# Patient Record
Sex: Male | Born: 1970 | Race: White | Hispanic: No | Marital: Married | Smoking: Never smoker
Health system: Southern US, Community
[De-identification: ages and names within clinical notes are randomized; demographics above are authoritative.]

## PROBLEM LIST (undated history)

## (undated) DIAGNOSIS — E785 Hyperlipidemia, unspecified: Secondary | ICD-10-CM

## (undated) DIAGNOSIS — Z8249 Family history of ischemic heart disease and other diseases of the circulatory system: Secondary | ICD-10-CM

## (undated) DIAGNOSIS — U071 COVID-19: Secondary | ICD-10-CM

## (undated) DIAGNOSIS — D239 Other benign neoplasm of skin, unspecified: Secondary | ICD-10-CM

## (undated) DIAGNOSIS — G2581 Restless legs syndrome: Secondary | ICD-10-CM

## (undated) DIAGNOSIS — T7840XA Allergy, unspecified, initial encounter: Secondary | ICD-10-CM

## (undated) DIAGNOSIS — R002 Palpitations: Secondary | ICD-10-CM

## (undated) DIAGNOSIS — K649 Unspecified hemorrhoids: Secondary | ICD-10-CM

## (undated) DIAGNOSIS — E559 Vitamin D deficiency, unspecified: Secondary | ICD-10-CM

## (undated) HISTORY — DX: COVID-19: U07.1

## (undated) HISTORY — DX: Palpitations: R00.2

## (undated) HISTORY — DX: Other benign neoplasm of skin, unspecified: D23.9

## (undated) HISTORY — DX: Hyperlipidemia, unspecified: E78.5

## (undated) HISTORY — DX: Vitamin D deficiency, unspecified: E55.9

## (undated) HISTORY — DX: Unspecified hemorrhoids: K64.9

## (undated) HISTORY — PX: THROAT SURGERY: SHX803

## (undated) HISTORY — PX: SHOULDER SURGERY: SHX246

## (undated) HISTORY — DX: Family history of ischemic heart disease and other diseases of the circulatory system: Z82.49

## (undated) HISTORY — DX: Allergy, unspecified, initial encounter: T78.40XA

## (undated) HISTORY — DX: Restless legs syndrome: G25.81

---

## 2001-05-10 ENCOUNTER — Ambulatory Visit (HOSPITAL_COMMUNITY): Admission: RE | Admit: 2001-05-10 | Discharge: 2001-05-10 | Payer: Self-pay | Admitting: Family Medicine

## 2004-01-27 ENCOUNTER — Emergency Department (HOSPITAL_COMMUNITY): Admission: EM | Admit: 2004-01-27 | Discharge: 2004-01-27 | Payer: Self-pay | Admitting: Emergency Medicine

## 2008-12-24 ENCOUNTER — Encounter: Admission: RE | Admit: 2008-12-24 | Discharge: 2008-12-24 | Payer: Self-pay | Admitting: Internal Medicine

## 2012-12-07 HISTORY — PX: SKIN CANCER EXCISION: SHX779

## 2013-03-11 ENCOUNTER — Other Ambulatory Visit: Payer: Self-pay | Admitting: Internal Medicine

## 2013-03-11 DIAGNOSIS — R7401 Elevation of levels of liver transaminase levels: Secondary | ICD-10-CM

## 2013-03-14 ENCOUNTER — Ambulatory Visit
Admission: RE | Admit: 2013-03-14 | Discharge: 2013-03-14 | Disposition: A | Discharge status: Home / Self Care | Payer: BC Managed Care – PPO | Source: Ambulatory Visit | Attending: Internal Medicine | Admitting: Internal Medicine

## 2013-03-14 DIAGNOSIS — R7401 Elevation of levels of liver transaminase levels: Secondary | ICD-10-CM

## 2013-03-18 ENCOUNTER — Other Ambulatory Visit: Payer: Self-pay | Admitting: Emergency Medicine

## 2013-03-18 ENCOUNTER — Telehealth: Payer: Self-pay

## 2013-03-18 DIAGNOSIS — B351 Tinea unguium: Secondary | ICD-10-CM

## 2013-03-18 MED ORDER — TERBINAFINE HCL 250 MG PO TABS: 250.0000 mg | ORAL_TABLET | Freq: Every day | ORAL | Status: DC

## 2013-03-18 NOTE — Telephone Encounter (Signed)
Returned call

## 2013-03-28 ENCOUNTER — Encounter: Payer: Self-pay | Admitting: Physician Assistant

## 2013-03-28 DIAGNOSIS — T7840XA Allergy, unspecified, initial encounter: Secondary | ICD-10-CM | POA: Insufficient documentation

## 2013-03-28 DIAGNOSIS — E785 Hyperlipidemia, unspecified: Secondary | ICD-10-CM

## 2013-03-28 DIAGNOSIS — T7840XD Allergy, unspecified, subsequent encounter: Secondary | ICD-10-CM

## 2013-03-28 DIAGNOSIS — E559 Vitamin D deficiency, unspecified: Secondary | ICD-10-CM | POA: Insufficient documentation

## 2013-03-29 ENCOUNTER — Encounter: Payer: Self-pay | Admitting: Physician Assistant

## 2013-03-29 ENCOUNTER — Ambulatory Visit: Payer: BC Managed Care – PPO | Admitting: Physician Assistant

## 2013-03-29 VITALS — BP 102/68 | HR 64 | Temp 98.8°F | Resp 16 | Ht 72.5 in | Wt 227.0 lb

## 2013-03-29 DIAGNOSIS — J209 Acute bronchitis, unspecified: Secondary | ICD-10-CM

## 2013-03-29 MED ORDER — PREDNISONE 20 MG PO TABS: ORAL_TABLET | ORAL | Status: DC

## 2013-03-29 MED ORDER — AZITHROMYCIN 250 MG PO TABS: ORAL_TABLET | ORAL | Status: DC

## 2013-03-29 NOTE — Progress Notes (Signed)
  Subjective:    Patient ID: Kristopher Mendez, male    DOB: 04/02/1971, 42 y.o.   MRN: [id number]  URI  This is a new problem. The current episode started in the past 7 days. The problem has been gradually worsening. There has been no fever. Associated symptoms include congestion, coughing, rhinorrhea, sinus pain, sneezing and wheezing. Pertinent negatives include no abdominal pain, chest pain, diarrhea, ear pain, nausea, neck pain, plugged ear sensation, sore throat, swollen glands or vomiting. He has tried antihistamine for the symptoms. The treatment provided no relief.    Current Outpatient Prescriptions on File Prior to Visit  Medication Sig Dispense Refill  . cetirizine (ZYRTEC) 10 MG tablet Take 10 mg by mouth daily.      . cholecalciferol (VITAMIN D) 1000 UNITS tablet Take 5,000 Units by mouth daily.      . Magnesium 250 MG TABS Take by mouth 2 (two) times daily.      . Multiple Vitamins-Minerals (MULTIVITAMIN PO) Take by mouth.      . Omega-3 Fatty Acids (FISH OIL) 1000 MG CAPS Take by mouth.      . Probiotic Product (PROBIOTIC DAILY PO) Take by mouth.      . terbinafine (LAMISIL) 250 MG tablet Take 1 tablet (250 mg total) by mouth daily.  42 tablet  0  . thyroid (WP THYROID) 32.5 MG tablet Take 32.5 mg by mouth daily.       No current facility-administered medications on file prior to visit.   Past Medical History  Diagnosis Date  . Allergy   . Hyperlipidemia   . Vitamin D deficiency   . Dysplastic nevus     left shoulder   Review of Systems  Constitutional: Negative.   HENT: Positive for congestion, postnasal drip, rhinorrhea, sinus pressure and sneezing. Negative for ear pain, hearing loss, nosebleeds and sore throat.   Eyes: Negative.   Respiratory: Positive for cough, chest tightness (and tightness in his back with coughing) and wheezing.   Cardiovascular: Negative for chest pain, palpitations and leg swelling.  Gastrointestinal: Negative for nausea, vomiting, abdominal  pain and diarrhea.  Endocrine: Negative.   Genitourinary: Negative.   Musculoskeletal: Negative for neck pain.      Objective:   Physical Exam  Constitutional: He appears well-developed and well-nourished.  HENT:  Right Ear: External ear normal.  Left Ear: External ear normal.  Nose: Nose normal.  Mouth/Throat: Oropharynx is clear and moist.  Eyes: Conjunctivae are normal. Pupils are equal, round, and reactive to light.  Neck: Normal range of motion. Neck supple.  Cardiovascular: Normal rate, regular rhythm and normal heart sounds.   Pulmonary/Chest: No respiratory distress. He has wheezes (right middle lobe). He has no rales. He exhibits no tenderness.  Abdominal: Soft. Bowel sounds are normal.  Lymphadenopathy:    He has no cervical adenopathy.  Skin: Skin is warm and dry.       Assessment & Plan:  1. Acute bronchitis Call if any worsening symptoms - azithromycin (ZITHROMAX) 250 MG tablet; Take 2 tablets the first day and then 1 tablet daily for 4 days  Dispense: 6 tablet; Refill: 0 - predniSONE (DELTASONE) 20 MG tablet; take one tablet two times daily with food for 3 days, take one tablet daily for 4 days.  Dispense: 10 tablet; Refill: 0

## 2013-04-02 ENCOUNTER — Encounter: Payer: Self-pay | Admitting: Emergency Medicine

## 2013-04-29 ENCOUNTER — Ambulatory Visit (INDEPENDENT_AMBULATORY_CARE_PROVIDER_SITE_OTHER): Payer: BC Managed Care – PPO | Admitting: Emergency Medicine

## 2013-04-29 ENCOUNTER — Encounter: Payer: Self-pay | Admitting: Emergency Medicine

## 2013-04-29 VITALS — BP 118/70 | HR 76 | Temp 98.2°F | Resp 18 | Ht 72.5 in | Wt 228.0 lb

## 2013-04-29 DIAGNOSIS — R748 Abnormal levels of other serum enzymes: Secondary | ICD-10-CM

## 2013-04-29 DIAGNOSIS — E782 Mixed hyperlipidemia: Secondary | ICD-10-CM

## 2013-04-29 LAB — HEPATIC FUNCTION PANEL
Albumin: 4.3 g/dL (ref 3.5–5.2)
Alkaline Phosphatase: 128 U/L — ABNORMAL HIGH (ref 39–117)
Indirect Bilirubin: 0.3 mg/dL (ref 0.0–0.9)
Total Bilirubin: 0.4 mg/dL (ref 0.3–1.2)

## 2013-04-29 LAB — LIPID PANEL: LDL Cholesterol: 104 mg/dL — ABNORMAL HIGH (ref 0–99)

## 2013-04-29 MED ORDER — NYSTATIN 100000 UNIT/GM EX POWD: CUTANEOUS | Status: DC

## 2013-04-29 NOTE — Progress Notes (Signed)
Subjective:    Patient ID: Kristopher Mendez, male    DOB: June 02, 1970, 42 y.o.   MRN: [id number]  HPI Comments: 42 yo male for cholesterol/ alk phos/ Lamisil f/u. He is being [facility] [place] and has been improving labs, especially testosterone. ALK PHOS 124 T 158 TG 88 H 43 LDL 97 A1C 5.2 D 89 TESTOSTERONE 552 He feels well overall. He notes exercise and diet have not been as good over last month.  He notes lamisil and Nystatin have improved toe nail fungus, jock itch and ring worm and needs refills.  Hyperlipidemia    Current Outpatient Prescriptions on File Prior to Visit  Medication Sig Dispense Refill  . cetirizine (ZYRTEC) 10 MG tablet Take 10 mg by mouth daily.      . cholecalciferol (VITAMIN D) 1000 UNITS tablet Take 5,000 Units by mouth daily.      . Magnesium 250 MG TABS Take by mouth 2 (two) times daily.      . Multiple Vitamins-Minerals (MULTIVITAMIN PO) Take by mouth.      . Omega-3 Fatty Acids (FISH OIL) 1000 MG CAPS Take by mouth.      . Pregnenolone POWD 100 mg.      . Probiotic Product (PROBIOTIC DAILY PO) Take by mouth.      . thyroid (WP THYROID) 32.5 MG tablet Take 32.5 mg by mouth daily.      Marland Kitchen azithromycin (ZITHROMAX) 250 MG tablet Take 2 tablets the first day and then 1 tablet daily for 4 days  6 tablet  0  . predniSONE (DELTASONE) 20 MG tablet take one tablet two times daily with food for 3 days, take one tablet daily for 4 days.  10 tablet  0   No current facility-administered medications on file prior to visit.    Review of patient's allergies indicates no known allergies.  Past Medical History  Diagnosis Date  . Allergy   . Hyperlipidemia   . Vitamin D deficiency   . Dysplastic nevus     left shoulder     Review of Systems  All other systems reviewed and are negative.   BP 118/70  Pulse 76  Temp(Src) 98.2 F (36.8 C) (Temporal)  Resp 18  Ht 6' 0.5" (1.842 m)  Wt 228 lb (103.42 kg)  BMI 30.48 kg/m2     Objective:   Physical Exam  Nursing note and vitals reviewed. Constitutional: He is oriented to person, place, and time. He appears well-developed and well-nourished.  HENT:  Head: Normocephalic and atraumatic.  Right Ear: External ear normal.  Left Ear: External ear normal.  Nose: Nose normal.  Eyes: Conjunctivae and EOM are normal.  Neck: Normal range of motion. Neck supple. No JVD present. No thyromegaly present.  Cardiovascular: Normal rate, regular rhythm, normal heart sounds and intact distal pulses.   Pulmonary/Chest: Effort normal and breath sounds normal.  Abdominal: Soft. Bowel sounds are normal. He exhibits no distension and no mass. There is no tenderness. There is no rebound and no guarding.  Musculoskeletal: Normal range of motion. He exhibits no edema and no tenderness.  Lymphadenopathy:    He has no cervical adenopathy.  Neurological: He is alert and oriented to person, place, and time. He has normal reflexes. No cranial nerve deficit. Coordination normal.  Skin: Skin is warm and dry.  Exposed area wnl  Psychiatric: He has a normal mood and affect. His behavior is normal. Judgment and thought content normal.  Assessment & Plan:  1. Cholesterol- recheck labs 2. Alk phos elevation- Recheck labs 3. Lamisil/ nystatin refill needed with improved nail fungus and ringworm

## 2013-04-29 NOTE — Patient Instructions (Signed)
Cholesterol  Cholesterol is a type of fat. Your body needs a small amount of cholesterol, but too much can cause health problems. Certain problems include heart attacks, strokes, and not enough blood flow to your heart, brain, kidneys, or feet. You get cholesterol in 2 ways:   Naturally.   By eating certain foods.  HOME CARE   Eat a low-fat diet:   Eat less eggs, whole dairy products (whole milk, cheese, and butter), fatty meats, and fried foods.   Eat more fruits, vegetables, whole-wheat breads, lean chicken, and fish.   Follow your exercise program as told by your doctor.   Keep your weight at a healthy level. Talk to your doctor about what is right for you.   Only take medicine as told by your doctor.   Get your cholesterol checked once a year or as told by your doctor.  MAKE SURE YOU:   Understand these instructions.   Will watch your condition.   Will get help right away if you are not doing well or get worse.  Document Released: 07/22/2008 Document Revised: 07/18/2011 Document Reviewed: 07/22/2008  Exit[employer] Patient Information 2014 [employer].

## 2013-04-30 ENCOUNTER — Other Ambulatory Visit: Payer: Self-pay | Admitting: Emergency Medicine

## 2013-04-30 DIAGNOSIS — B351 Tinea unguium: Secondary | ICD-10-CM

## 2013-04-30 MED ORDER — TERBINAFINE HCL 250 MG PO TABS: 250.0000 mg | ORAL_TABLET | Freq: Every day | ORAL | Status: DC

## 2013-05-09 DIAGNOSIS — K649 Unspecified hemorrhoids: Secondary | ICD-10-CM

## 2013-05-09 HISTORY — DX: Unspecified hemorrhoids: K64.9

## 2013-05-09 HISTORY — PX: HEMORROIDECTOMY: SUR656

## 2013-05-15 ENCOUNTER — Encounter: Payer: Self-pay | Admitting: Emergency Medicine

## 2013-05-16 NOTE — Telephone Encounter (Signed)
Spoke with patient about email questioning Lamasil Rx refill.  Advised patient that Kelby Aline, PA-C sent refill into [facility], [facility] 04/30/13.  Advised him to check pharmacy and if not there we could resend Rx.

## 2013-06-04 ENCOUNTER — Encounter: Payer: Self-pay | Admitting: Emergency Medicine

## 2013-06-04 ENCOUNTER — Other Ambulatory Visit: Payer: Self-pay | Admitting: Emergency Medicine

## 2013-06-04 MED ORDER — NYSTATIN 100000 UNIT/GM EX POWD: CUTANEOUS | Status: DC

## 2013-06-17 ENCOUNTER — Encounter: Payer: Self-pay | Admitting: Emergency Medicine

## 2013-06-30 ENCOUNTER — Other Ambulatory Visit: Payer: Self-pay | Admitting: Emergency Medicine

## 2013-06-30 ENCOUNTER — Encounter: Payer: Self-pay | Admitting: Emergency Medicine

## 2013-06-30 DIAGNOSIS — Z0000 Encounter for general adult medical examination without abnormal findings: Secondary | ICD-10-CM

## 2013-07-01 ENCOUNTER — Other Ambulatory Visit: Payer: BC Managed Care – PPO

## 2013-07-01 DIAGNOSIS — Z0000 Encounter for general adult medical examination without abnormal findings: Secondary | ICD-10-CM

## 2013-07-01 LAB — CBC WITH DIFFERENTIAL/PLATELET
Basophils Absolute: 0 10*3/uL (ref 0.0–0.1)
Basophils Relative: 0 % (ref 0–1)
EOS PCT: 2 % (ref 0–5)
Eosinophils Absolute: 0.1 10*3/uL (ref 0.0–0.7)
HCT: 45.3 % (ref 39.0–52.0)
HEMOGLOBIN: 16 g/dL (ref 13.0–17.0)
LYMPHS ABS: 1.8 10*3/uL (ref 0.7–4.0)
LYMPHS PCT: 33 % (ref 12–46)
MCH: 32.1 pg (ref 26.0–34.0)
MCHC: 35.3 g/dL (ref 30.0–36.0)
MCV: 91 fL (ref 78.0–100.0)
MONOS PCT: 10 % (ref 3–12)
Monocytes Absolute: 0.6 10*3/uL (ref 0.1–1.0)
NEUTROS PCT: 55 % (ref 43–77)
Neutro Abs: 3 10*3/uL (ref 1.7–7.7)
PLATELETS: 210 10*3/uL (ref 150–400)
RBC: 4.98 MIL/uL (ref 4.22–5.81)
RDW: 13.3 % (ref 11.5–15.5)
WBC: 5.5 10*3/uL (ref 4.0–10.5)

## 2013-07-01 LAB — LIPID PANEL
CHOLESTEROL: 188 mg/dL (ref 0–200)
HDL: 49 mg/dL (ref >39)
LDL Cholesterol: 130 mg/dL — ABNORMAL HIGH (ref 0–99)
TRIGLYCERIDES: 46 mg/dL (ref <150)
Total CHOL/HDL Ratio: 3.8 Ratio
VLDL: 9 mg/dL (ref 0–40)

## 2013-07-01 LAB — URINALYSIS, ROUTINE W REFLEX MICROSCOPIC
Bilirubin Urine: NEGATIVE
Glucose, UA: NEGATIVE mg/dL
Hgb urine dipstick: NEGATIVE
Ketones, ur: NEGATIVE mg/dL
LEUKOCYTES UA: NEGATIVE
NITRITE: NEGATIVE
PROTEIN: NEGATIVE mg/dL
SPECIFIC GRAVITY, URINE: 1.023 (ref 1.005–1.030)
UROBILINOGEN UA: 0.2 mg/dL (ref 0.0–1.0)
pH: 6 (ref 5.0–8.0)

## 2013-07-01 LAB — MICROALBUMIN / CREATININE URINE RATIO
Creatinine, Urine: 230.1 mg/dL
MICROALB UR: 0.5 mg/dL (ref 0.00–1.89)
Microalb Creat Ratio: 2.2 mg/g (ref 0.0–30.0)

## 2013-07-01 LAB — PSA: PSA: 1.44 ng/mL (ref <=4.00)

## 2013-07-01 LAB — MAGNESIUM: Magnesium: 1.9 mg/dL (ref 1.5–2.5)

## 2013-07-01 LAB — TSH: TSH: 1.823 u[IU]/mL (ref 0.350–4.500)

## 2013-07-02 LAB — INSULIN, FASTING: Insulin fasting, serum: 7 u[IU]/mL (ref 3–28)

## 2013-07-03 ENCOUNTER — Encounter: Payer: Self-pay | Admitting: Emergency Medicine

## 2013-07-03 ENCOUNTER — Ambulatory Visit (INDEPENDENT_AMBULATORY_CARE_PROVIDER_SITE_OTHER): Payer: BC Managed Care – PPO | Admitting: Emergency Medicine

## 2013-07-03 VITALS — BP 118/72 | HR 66 | Temp 97.8°F | Resp 18 | Ht 72.5 in | Wt 232.0 lb

## 2013-07-03 DIAGNOSIS — Z0000 Encounter for general adult medical examination without abnormal findings: Secondary | ICD-10-CM

## 2013-07-03 DIAGNOSIS — Z111 Encounter for screening for respiratory tuberculosis: Secondary | ICD-10-CM

## 2013-07-03 DIAGNOSIS — B354 Tinea corporis: Secondary | ICD-10-CM

## 2013-07-03 DIAGNOSIS — E782 Mixed hyperlipidemia: Secondary | ICD-10-CM

## 2013-07-03 DIAGNOSIS — Z113 Encounter for screening for infections with a predominantly sexual mode of transmission: Secondary | ICD-10-CM

## 2013-07-03 DIAGNOSIS — B351 Tinea unguium: Secondary | ICD-10-CM

## 2013-07-03 MED ORDER — KETOCONAZOLE 2 % EX CREA
1.0000 | TOPICAL_CREAM | Freq: Every day | CUTANEOUS | Status: DC
Start: 2013-07-03 — End: 2014-07-08

## 2013-07-03 MED ORDER — TERBINAFINE HCL 1 % EX CREA: 1.0000 "application " | TOPICAL_CREAM | Freq: Two times a day (BID) | CUTANEOUS | Status: DC

## 2013-07-03 NOTE — Patient Instructions (Signed)
Fat and Cholesterol Control Diet Fat and cholesterol levels in your blood and organs are influenced by your diet. High levels of fat and cholesterol may lead to diseases of the heart, small and large blood vessels, gallbladder, liver, and pancreas. CONTROLLING FAT AND CHOLESTEROL WITH DIET Although exercise and lifestyle factors are important, your diet is key. That is because certain foods are known to raise cholesterol and others to lower it. The goal is to balance foods for their effect on cholesterol and more importantly, to replace saturated and trans fat with other types of fat, such as monounsaturated fat, polyunsaturated fat, and omega-3 fatty acids. On average, a person should consume no more than 15 to 17 g of saturated fat daily. Saturated and trans fats are considered "bad" fats, and they will raise LDL cholesterol. Saturated fats are primarily found in animal products such as meats, butter, and cream. However, that does not mean you need to give up all your favorite foods. Today, there are good tasting, low-fat, low-cholesterol substitutes for most of the things you like to eat. Choose low-fat or nonfat alternatives. Choose round or loin cuts of red meat. These types of cuts are lowest in fat and cholesterol. Chicken (without the skin), fish, veal, and ground turkey breast are great choices. Eliminate fatty meats, such as hot dogs and salami. Even shellfish have little or no saturated fat. Have a 3 oz (85 g) portion when you eat lean meat, poultry, or fish. Trans fats are also called "partially hydrogenated oils." They are oils that have been scientifically manipulated so that they are solid at room temperature resulting in a longer shelf life and improved taste and texture of foods in which they are added. Trans fats are found in stick margarine, some tub margarines, cookies, crackers, and baked goods.  When baking and cooking, oils are a great substitute for butter. The monounsaturated oils are  especially beneficial since it is believed they lower LDL and raise HDL. The oils you should avoid entirely are saturated tropical oils, such as coconut and palm.  Remember to eat a lot from food groups that are naturally free of saturated and trans fat, including fish, fruit, vegetables, beans, grains (barley, rice, couscous, bulgur wheat), and pasta (without cream sauces).  IDENTIFYING FOODS THAT LOWER FAT AND CHOLESTEROL  Soluble fiber may lower your cholesterol. This type of fiber is found in fruits such as apples, vegetables such as broccoli, potatoes, and carrots, legumes such as beans, peas, and lentils, and grains such as barley. Foods fortified with plant sterols (phytosterol) may also lower cholesterol. You should eat at least 2 g per day of these foods for a cholesterol lowering effect.  Read package labels to identify low-saturated fats, trans fat free, and low-fat foods at the supermarket. Select cheeses that have only 2 to 3 g saturated fat per ounce. Use a heart-healthy tub margarine that is free of trans fats or partially hydrogenated oil. When buying baked goods (cookies, crackers), avoid partially hydrogenated oils. Breads and muffins should be made from whole grains (whole-wheat or whole oat flour, instead of "flour" or "enriched flour"). Buy non-creamy canned soups with reduced salt and no added fats.  FOOD PREPARATION TECHNIQUES  Never deep-fry. If you must fry, either stir-fry, which uses very little fat, or use non-stick cooking sprays. When possible, broil, bake, or roast meats, and steam vegetables. Instead of putting butter or margarine on vegetables, use lemon and herbs, applesauce, and cinnamon (for squash and sweet potatoes). Use nonfat   yogurt, salsa, and low-fat dressings for salads.  LOW-SATURATED FAT / LOW-FAT FOOD SUBSTITUTES Meats / Saturated Fat (g)  Avoid: Steak, marbled (3 oz/85 g) / 11 g  Choose: Steak, lean (3 oz/85 g) / 4 g  Avoid: Hamburger (3 oz/85 g) / 7  g  Choose: Hamburger, lean (3 oz/85 g) / 5 g  Avoid: Ham (3 oz/85 g) / 6 g  Choose: Ham, lean cut (3 oz/85 g) / 2.4 g  Avoid: Chicken, with skin, dark meat (3 oz/85 g) / 4 g  Choose: Chicken, skin removed, dark meat (3 oz/85 g) / 2 g  Avoid: Chicken, with skin, light meat (3 oz/85 g) / 2.5 g  Choose: Chicken, skin removed, light meat (3 oz/85 g) / 1 g Dairy / Saturated Fat (g)  Avoid: Whole milk (1 cup) / 5 g  Choose: Low-fat milk, 2% (1 cup) / 3 g  Choose: Low-fat milk, 1% (1 cup) / 1.5 g  Choose: Skim milk (1 cup) / 0.3 g  Avoid: Hard cheese (1 oz/28 g) / 6 g  Choose: Skim milk cheese (1 oz/28 g) / 2 to 3 g  Avoid: Cottage cheese, 4% fat (1 cup) / 6.5 g  Choose: Low-fat cottage cheese, 1% fat (1 cup) / 1.5 g  Avoid: Ice cream (1 cup) / 9 g  Choose: Sherbet (1 cup) / 2.5 g  Choose: Nonfat frozen yogurt (1 cup) / 0.3 g  Choose: Frozen fruit bar / trace  Avoid: Whipped cream (1 tbs) / 3.5 g  Choose: Nondairy whipped topping (1 tbs) / 1 g Condiments / Saturated Fat (g)  Avoid: Mayonnaise (1 tbs) / 2 g  Choose: Low-fat mayonnaise (1 tbs) / 1 g  Avoid: Butter (1 tbs) / 7 g  Choose: Extra light margarine (1 tbs) / 1 g  Avoid: Coconut oil (1 tbs) / 11.8 g  Choose: Olive oil (1 tbs) / 1.8 g  Choose: Corn oil (1 tbs) / 1.7 g  Choose: Safflower oil (1 tbs) / 1.2 g  Choose: Sunflower oil (1 tbs) / 1.4 g  Choose: Soybean oil (1 tbs) / 2.4 g  Choose: Canola oil (1 tbs) / 1 g Document Released: 04/25/2005 Document Revised: 08/20/2012 Document Reviewed: 10/14/2010 ExitCare Patient Information 2014 [place], [place]. Sexually Transmitted Disease A sexually transmitted disease (STD) is a disease or infection. It may be passed from person to person. It usually is passed during sex. STDs can be spread by different types of germs. These germs are bacteria, viruses, and parasites. An STD can be passed through:  Spit (saliva).  Semen.  Blood.  Mucus from the  vagina.  Pee (urine). HOW CAN I LESSEN MY CHANCES OF GETTING AN STD?  Only use condoms labeled "latex," dental dams, and lubricants that wash away with water (water soluble). Do not use petroleum jelly or oils.  Get shots (vaccines) for HPV and hepatitis.  Avoid risky sex behavior that can break the skin. WHAT SHOULD I DO IF I THINK I HAVE AN STD?  See your doctor.  Tell your sex partner(s) that you have an STD. They should be tested and treated.  Do not have sex until your doctor says it is OK. WHEN SHOULD I GET HELP? Get help if:  You have bad belly (abdominal) pain.  You are a man and have puffiness (swelling) or pain in your testicles.  You are a woman and have puffiness in your vagina. MAKE SURE YOU:  Understand these instructions. Document Released:  06/02/2004 Document Revised: 02/13/2013 Document Reviewed: 10/19/2012 [facility] Patient Information 2014 [place].

## 2013-07-04 LAB — HEPATITIS PANEL, ACUTE
HCV AB: NEGATIVE
HEP A IGM: NONREACTIVE
HEP B C IGM: NONREACTIVE
HEP B S AG: NEGATIVE

## 2013-07-04 LAB — RPR

## 2013-07-04 LAB — HIV ANTIBODY (ROUTINE TESTING W REFLEX): HIV: NONREACTIVE

## 2013-07-05 ENCOUNTER — Encounter: Payer: Self-pay | Admitting: Emergency Medicine

## 2013-07-05 DIAGNOSIS — B354 Tinea corporis: Secondary | ICD-10-CM | POA: Insufficient documentation

## 2013-07-05 DIAGNOSIS — E782 Mixed hyperlipidemia: Secondary | ICD-10-CM | POA: Insufficient documentation

## 2013-07-05 DIAGNOSIS — B351 Tinea unguium: Secondary | ICD-10-CM | POA: Insufficient documentation

## 2013-07-05 LAB — HSV(HERPES SIMPLEX VRS) I + II AB-IGG: HSV 2 Glycoprotein G Ab, IgG: <0.1 IV

## 2013-07-05 LAB — GC PROBE AMPLIFICATION, URINE: GC Probe Amp, Urine: NEGATIVE

## 2013-07-05 LAB — TB SKIN TEST
Induration: 0 mm
TB Skin Test: NEGATIVE

## 2013-07-05 NOTE — Progress Notes (Signed)
Subjective:    Patient ID: Kristopher Mendez, male    DOB: [birth year], 43 y.o.   MRN: [id number]  HPI Comments: 43 yo pleasant male for CPE and cholesterol recheck. He is eating healthy and exercising routinely. He declines cholesterol RX despite risk at this point in time. LAST LABS  ALK PHOS 124  T 158 TG 88 H 43 L 97 MAG 2 A1C 5.2 D 89 WITH NEG ABDOMEN U/S 03/14/13 He has gained a little weight over the holidays and since recent stress with relationship change. He was in monogamous same sex relationship x 8 years. He notes he initially was more upset but has improved since break up. He is not sure if his partner was monogamous or not. He has not signs of STD but wants to be screened. He has noticed a small hard lump on the outside of scrotum without any pain or change x several weeks.   He has had recent derm evaluation with [place] and she had recommended lamisil with severe tinea of the groin. Patient notes jock rash as long as he can remember. He notes mild change with Lamisil/ probiotics and hygiene changes. He also had skin cancer removed by Derm last year on left shoulder and has f/u for recheck soon.  MAINTENANCE: TDAP 06/02/10 INFLUNENZA- N/A EGG ALLERGY   PPD NEG 06/29/11 HEMOCULTS- PT NONCOMPLIANT  Care team: [place] 2014 [facility] Dentist- Dr Aaron Mose- 05/2013 WNL Q66month Alternist- Dr WRoena Malady Q657monthDermatology- Dr CaWillaim Sheng/2014 SKin Cancer/ due 12/2013  Hyperlipidemia    Current Outpatient Prescriptions on File Prior to Visit  Medication Sig Dispense Refill  . cetirizine (ZYRTEC) 10 MG tablet Take 10 mg by mouth daily.      . cholecalciferol (VITAMIN D) 1000 UNITS tablet Take 5,000 Units by mouth daily.      . Magnesium 250 MG TABS Take by mouth 2 (two) times daily.      . Multiple Vitamins-Minerals (MULTIVITAMIN PO) Take by mouth.      . nystatin (MYCOSTATIN/NYSTOP) 100000 UNIT/GM POWD APPLY BID  60 g  2  . Omega-3 Fatty Acids (FISH OIL) 1000 MG CAPS Take by  mouth.      . predniSONE (DELTASONE) 20 MG tablet take one tablet two times daily with food for 3 days, take one tablet daily for 4 days.  10 tablet  0  . Pregnenolone POWD 100 mg.      . Probiotic Product (PROBIOTIC DAILY PO) Take by mouth.      . terbinafine (LAMISIL) 250 MG tablet Take 1 tablet (250 mg total) by mouth daily.  42 tablet  0  . thyroid (WP THYROID) 32.5 MG tablet Take 32.5 mg by mouth daily.       No current facility-administered medications on file prior to visit.   No Known Allergies Past Medical History  Diagnosis Date  . Allergy   . Hyperlipidemia   . Vitamin D deficiency   . Dysplastic nevus     left shoulder    History  Substance Use Topics  . Smoking status: Never Smoker   . Smokeless tobacco: Never Used  . Alcohol Use: No   Past Surgical History  Procedure Laterality Date  . Shoulder surgery Right     spurs  12/2012 SKIN CANCER REMOVAL LEFT SHOULDER  Family History  Problem Relation Age of Onset  . Diabetes Father   . Hyperlipidemia Father   . Heart disease Father 714. Hypertension Father   .  Hypertension Other   . Heart disease Other   . Hyperlipidemia Other   . Hyperlipidemia Mother   . Heart disease Mother 57  . Hypertension Mother   . Heart disease Brother   . Hyperlipidemia Brother   . Hypertension Brother   . Cancer Maternal Grandmother     lymphoma     Review of Systems  Skin: Positive for rash.  All other systems reviewed and are negative.   BP 118/72  Pulse 66  Temp(Src) 97.8 F (36.6 C) (Temporal)  Resp 18  Ht 6' 0.5" (1.842 m)  Wt 232 lb (105.235 kg)  BMI 31.02 kg/m2     Objective:   Physical Exam  Nursing note and vitals reviewed. Constitutional: He is oriented to person, place, and time. He appears well-developed and well-nourished.  HENT:  Head: Normocephalic and atraumatic.  Right Ear: External ear normal.  Left Ear: External ear normal.  Nose: Nose normal.  Eyes: Conjunctivae and EOM are normal. Pupils  are equal, round, and reactive to light. Right eye exhibits no discharge. Left eye exhibits no discharge. No scleral icterus.  Neck: Normal range of motion. Neck supple. No JVD present. No tracheal deviation present. No thyromegaly present.  Cardiovascular: Normal rate, regular rhythm, normal heart sounds and intact distal pulses.   Pulmonary/Chest: Effort normal and breath sounds normal.  Abdominal: Soft. Bowel sounds are normal. He exhibits no distension and no mass. There is no tenderness. There is no rebound and no guarding.  Genitourinary: Penis normal. No penile tenderness.  LEFT POSTERIOR SCROTUM .5CM UMBILICATED FIRM FLESHY COLORED MINIMAL ERYTHEMA, ? STD vs skin change  Musculoskeletal: Normal range of motion. He exhibits no edema and no tenderness.  Lymphadenopathy:    He has no cervical adenopathy.  Neurological: He is alert and oriented to person, place, and time. He has normal reflexes. No cranial nerve deficit. He exhibits normal muscle tone. Coordination normal.  Skin: Skin is warm and dry. No rash noted. No erythema. No pallor.     Psychiatric: He has a normal mood and affect. His behavior is normal. Judgment and thought content normal.     EKG NSCSPT WNL     Assessment & Plan:  1. CPE- Update screening labs/ History/ Immunizations/ Testing as needed. Advised healthy diet, QD exercise, increase H20 and continue RX/ Vitamins AD. 2. Tinea chronic/ nail fungus- with derm evaluation and recent Lamisil x 2 cycles of 6 wk ea without cure- d/c lamisil for 6 week break, hygiene explained, Nizoral cream AD. Try Super glue treatment AD for toe nail fungus. 3. Cholesterol- Declines RX despite fmhx, will continue to improve diet/ exercise 4. Recent stress with relationship change- will start counseling, declines RX need currently

## 2013-07-10 ENCOUNTER — Encounter: Payer: Self-pay | Admitting: Emergency Medicine

## 2013-07-15 NOTE — Addendum Note (Signed)
Addended by: Kelby Aline R on: 07/15/2013 08:52 AM   Modules accepted: Orders

## 2013-09-10 ENCOUNTER — Other Ambulatory Visit: Payer: Self-pay | Admitting: Emergency Medicine

## 2013-09-11 ENCOUNTER — Encounter: Payer: Self-pay | Admitting: Emergency Medicine

## 2013-09-11 MED ORDER — AZITHROMYCIN 250 MG PO TABS: ORAL_TABLET | ORAL | Status: DC

## 2013-10-17 ENCOUNTER — Encounter: Payer: Self-pay | Admitting: Emergency Medicine

## 2013-10-17 ENCOUNTER — Other Ambulatory Visit: Payer: Self-pay | Admitting: Emergency Medicine

## 2013-10-17 MED ORDER — NYSTATIN 100000 UNIT/GM EX POWD: CUTANEOUS | Status: DC

## 2013-11-18 ENCOUNTER — Encounter: Payer: Self-pay | Admitting: Emergency Medicine

## 2013-11-18 ENCOUNTER — Ambulatory Visit (INDEPENDENT_AMBULATORY_CARE_PROVIDER_SITE_OTHER): Payer: BC Managed Care – PPO | Admitting: Emergency Medicine

## 2013-11-18 VITALS — BP 116/64 | HR 72 | Temp 98.2°F | Resp 18 | Ht 72.5 in | Wt 237.0 lb

## 2013-11-18 DIAGNOSIS — K641 Second degree hemorrhoids: Secondary | ICD-10-CM

## 2013-11-18 DIAGNOSIS — K649 Unspecified hemorrhoids: Secondary | ICD-10-CM

## 2013-11-18 MED ORDER — HYDROCODONE-ACETAMINOPHEN 5-325 MG PO TABS: 1.0000 | ORAL_TABLET | Freq: Four times a day (QID) | ORAL | Status: DC | PRN

## 2013-11-18 MED ORDER — HYDROCORTISONE ACETATE 25 MG RE SUPP: 25.0000 mg | Freq: Two times a day (BID) | RECTAL | Status: DC

## 2013-11-18 MED ORDER — HYDROCORTISONE 2.5 % RE CREA: TOPICAL_CREAM | RECTAL | Status: DC

## 2013-11-18 NOTE — Progress Notes (Signed)
Subjective:    Patient ID: Kristopher Mendez, male    DOB: 04-Dec-1970, 43 y.o.   MRN: [id number]  HPI Comments: 43 yo WM with concerns for new hemorrhoids. He denies any straining. He notes BM are normal. He has been traveling a lot and had some diarrhea over the 4th of July. He is eating healthy and trying to exercise routinely. He denies blood with BM. He gets labs checked [facility] Boris [facility]. He has tried Preparation H without relief. He notes mother just had removal of hemorrhoids recently.   WBC             5.5   07/01/2013 HGB            16.0   07/01/2013 HCT            45.3   07/01/2013 PLT             210   07/01/2013 CHOL            188   07/01/2013 TRIG             46   07/01/2013 HDL              49   07/01/2013 LDLCALC         130   07/01/2013 ALT              33   04/29/2013 AST              26   04/29/2013 TSH           1.823   07/01/2013 PSA            1.44   07/01/2013 MICROALBUR     0.50   07/01/2013     Medication List       This list is accurate as of: 11/18/13  2:44 PM.  Always use your most recent med list.               cetirizine 10 MG tablet  Commonly known as:  ZYRTEC  Take 10 mg by mouth daily.     cholecalciferol 1000 UNITS tablet  Commonly known as:  VITAMIN D  Take 5,000 Units by mouth daily.     Fish Oil 1000 MG Caps  Take by mouth.     ketoconazole 2 % cream  Commonly known as:  NIZORAL  Apply 1 application topically daily.     Magnesium 250 MG Tabs  Take by mouth 2 (two) times daily.     MULTIVITAMIN PO  Take by mouth.     nystatin 100000 UNIT/GM Powd  APPLY TWICE A DAY     Pregnenolone Powd  100 mg.     PROBIOTIC DAILY PO  Take by mouth.     WP THYROID 32.5 MG tablet  Generic drug:  thyroid  Take 32.5 mg by mouth daily.       Allergies  Allergen Reactions  . Eggs Or Egg-Derived Products    Past Medical History  Diagnosis Date  . Allergy   . Hyperlipidemia   . Vitamin D deficiency   . Dysplastic nevus     left  shoulder     Review of Systems  Gastrointestinal: Positive for rectal pain.  All other systems reviewed and are negative.  BP 116/64  Pulse 72  Temp(Src) 98.2 F (36.8 C) (Temporal)  Resp 18  Ht 6' 0.5" (1.842 m)  Wt 237 lb (107.502 kg)  BMI 31.68 kg/m2     Objective:   Physical Exam  Nursing note and vitals reviewed. Constitutional: He is oriented to person, place, and time. He appears well-developed and well-nourished.  HENT:  Head: Normocephalic and atraumatic.  Right Ear: External ear normal.  Left Ear: External ear normal.  Nose: Nose normal.  Eyes: Conjunctivae are normal.  Neck: Normal range of motion.  Cardiovascular: Normal rate, regular rhythm, normal heart sounds and intact distal pulses.   Pulmonary/Chest: Effort normal and breath sounds normal.  Abdominal: Soft. Bowel sounds are normal. He exhibits no distension. There is no tenderness.  Genitourinary:  Dime size thrombosed hemorrhoid  Musculoskeletal: Normal range of motion.  Lymphadenopathy:    He has no cervical adenopathy.  Neurological: He is alert and oriented to person, place, and time.  Skin: Skin is warm and dry.  Psychiatric: He has a normal mood and affect. Judgment normal.          Assessment & Plan:  Hemorrhoid- ReF GEN SURGERY, Anusol cream and Suppositories AD, Sitz baths. Increase fiber, soft diet x 1 week.

## 2013-11-18 NOTE — Patient Instructions (Signed)

## 2013-11-20 ENCOUNTER — Ambulatory Visit (INDEPENDENT_AMBULATORY_CARE_PROVIDER_SITE_OTHER): Payer: BC Managed Care – PPO | Admitting: Surgery

## 2013-11-20 ENCOUNTER — Encounter (INDEPENDENT_AMBULATORY_CARE_PROVIDER_SITE_OTHER): Payer: Self-pay | Admitting: Surgery

## 2013-11-20 VITALS — BP 124/76 | HR 80 | Temp 98.0°F | Ht 73.0 in | Wt 233.0 lb

## 2013-11-20 DIAGNOSIS — K645 Perianal venous thrombosis: Secondary | ICD-10-CM

## 2013-11-20 NOTE — Progress Notes (Signed)
URGENT Office Kristopher Mendez 43 y.o.  Body mass index is 30.75 kg/(m^2).  Patient Active Problem List   Diagnosis Date Noted  . External hemorrhoid, thrombosed left side 11/20/2013  . Mixed hyperlipidemia 07/05/2013  . Ringworm of body 07/05/2013  . Nail fungus 07/05/2013  . Allergy   . Hyperlipidemia   . Vitamin D deficiency     Allergies  Allergen Reactions  . Eggs Or Egg-Derived Products     Past Surgical History  Procedure Laterality Date  . Shoulder surgery Right     spurs  . Skin cancer excision Left 12/2012    SHOULDER   MCKEOWN,WILLIAM DAVID, MD 1. External hemorrhoid, thrombosed   2. External hemorrhoid, thrombosed left side     2 day history of worsening perirectal pain and diagnosis of thrombosed hemorrhoid.  After discussing with him and obtaining informed consent, I infiltrated the left large thrombosed column with a mixture of lidocaine and neut.  I excised an ellipse of skin overlying the hemorrhoid and removed a large thrombosis.  Area was then reduced.  Continue pain meds and Sitz baths Return 2 weeks if needed. Matt B. Hassell Done, MD, [facility] [phone] beeper [phone]  11/20/2013 4:31 PM

## 2013-11-20 NOTE — Patient Instructions (Signed)
Sitz Bath °A sitz bath is a warm water bath taken in the sitting position that covers only the hips and buttocks. It may be used for either healing or hygiene purposes. Sitz baths are also used to relieve pain, itching, or muscle spasms. The water may contain medicine. Moist heat will help you heal and relax.  °HOME CARE INSTRUCTIONS  °Take 3 to 4 sitz baths a day. °1. Fill the bathtub half full with warm water. °2. Sit in the water and open the drain a little. °3. Turn on the warm water to keep the tub half full. Keep the water running constantly. °4. Soak in the water for 15 to 20 minutes. °5. After the sitz bath, pat the affected area dry first. °SEEK MEDICAL CARE IF:  °You get worse instead of better. Stop the sitz baths if you get worse. °MAKE SURE YOU: °· Understand these instructions. °· Will watch your condition. °· Will get help right away if you are not doing well or get worse. °Document Released: 01/16/2004 Document Revised: 01/18/2012 Document Reviewed: 07/23/2010 °Exit[employer]® Patient Information ©2015 [employer]. This information is not intended to replace advice given to you by your health care provider. Make sure you discuss any questions you have with your health care provider. ° °

## 2013-11-27 ENCOUNTER — Ambulatory Visit (INDEPENDENT_AMBULATORY_CARE_PROVIDER_SITE_OTHER): Payer: BC Managed Care – PPO | Admitting: General Surgery

## 2013-11-29 ENCOUNTER — Ambulatory Visit (INDEPENDENT_AMBULATORY_CARE_PROVIDER_SITE_OTHER): Payer: BC Managed Care – PPO | Admitting: General Surgery

## 2013-12-04 ENCOUNTER — Encounter: Payer: Self-pay | Admitting: Emergency Medicine

## 2013-12-05 ENCOUNTER — Other Ambulatory Visit: Payer: Self-pay | Admitting: Internal Medicine

## 2013-12-05 MED ORDER — NYSTATIN 100000 UNIT/GM EX POWD: CUTANEOUS | Status: DC

## 2013-12-10 ENCOUNTER — Ambulatory Visit (INDEPENDENT_AMBULATORY_CARE_PROVIDER_SITE_OTHER): Payer: BC Managed Care – PPO | Admitting: General Surgery

## 2014-01-08 ENCOUNTER — Encounter: Payer: Self-pay | Admitting: Emergency Medicine

## 2014-01-08 ENCOUNTER — Other Ambulatory Visit: Payer: Self-pay | Admitting: Internal Medicine

## 2014-01-08 MED ORDER — NYSTATIN 100000 UNIT/GM EX POWD: CUTANEOUS | Status: DC

## 2014-02-20 ENCOUNTER — Other Ambulatory Visit: Payer: Self-pay

## 2014-02-20 MED ORDER — MELOXICAM 15 MG PO TABS: ORAL_TABLET | ORAL | Status: DC

## 2014-02-20 MED ORDER — CYCLOBENZAPRINE HCL 10 MG PO TABS: 10.0000 mg | ORAL_TABLET | Freq: Two times a day (BID) | ORAL | Status: DC | PRN

## 2014-02-20 NOTE — Telephone Encounter (Signed)
Patient called complaining  of muscle pain , "knots" in back, per Vicie Mutters, PA , mobic and flexeril sent to pharmacy, if symptoms do not improve or get worse he will need to schedule a office visit , called patient at number provided [phone] lmom with instructions

## 2014-03-04 ENCOUNTER — Encounter: Payer: Self-pay | Admitting: Emergency Medicine

## 2014-03-04 MED ORDER — NYSTATIN 100000 UNIT/GM EX POWD: CUTANEOUS | Status: DC

## 2014-03-12 ENCOUNTER — Encounter: Payer: Self-pay | Admitting: Physician Assistant

## 2014-03-13 ENCOUNTER — Encounter: Payer: Self-pay | Admitting: Internal Medicine

## 2014-03-13 ENCOUNTER — Ambulatory Visit (INDEPENDENT_AMBULATORY_CARE_PROVIDER_SITE_OTHER): Payer: BC Managed Care – PPO | Admitting: Internal Medicine

## 2014-03-13 VITALS — BP 112/78 | HR 76 | Temp 98.1°F | Resp 16 | Ht 72.5 in | Wt 230.0 lb

## 2014-03-13 DIAGNOSIS — J029 Acute pharyngitis, unspecified: Secondary | ICD-10-CM

## 2014-03-13 DIAGNOSIS — J039 Acute tonsillitis, unspecified: Secondary | ICD-10-CM

## 2014-03-13 MED ORDER — PREDNISONE 20 MG PO TABS: ORAL_TABLET | ORAL | Status: DC

## 2014-03-13 MED ORDER — PROMETHAZINE HCL 6.25 MG/5ML PO SYRP: ORAL_SOLUTION | ORAL | Status: DC

## 2014-03-13 MED ORDER — AZITHROMYCIN 250 MG PO TABS: ORAL_TABLET | ORAL | Status: DC

## 2014-03-13 NOTE — Progress Notes (Signed)
Subjective:    Patient ID: Kristopher Mendez, male    DOB: 06/23/1970, 43 y.o.   MRN: [id number]  Sore Throat  This is a new problem. The current episode started in the past 7 days. The problem has been waxing and waning. Neither side of throat is experiencing more pain than the other. There has been no fever. The pain is at a severity of 3/10. The pain is moderate. Associated symptoms include congestion, ear pain, a plugged ear sensation and trouble swallowing. Pertinent negatives include no coughing, diarrhea, drooling, ear discharge, headaches, hoarse voice, neck pain, shortness of breath, stridor, swollen glands or vomiting.   Medication Sig  . cetirizine  10 MG tablet Take 10 mg by mouth daily.  Marland Kitchen VITAMIN D)1000 UNITS  Take 5,000 Units by mouth daily.  . cyclobenzaprine  10 MG tablet Take 1 tablet (10 mg total) by mouth 2 (two) times daily as needed for muscle spasms.  Marland Kitchen ketoconazole  2 % cream Apply 1 application topically daily.  . Magnesium 250 MG Take by mouth 2 (two) times daily.  . MULTIVITAMIN -Minerals Take by mouth.  . Omega-3 Fatty Acids (FISH OIL) 1000 MG CAPS Take by mouth.  . Pregnenolone POWD 100 mg.  . Probiotic   Take by mouth.  . thyroid (WP THYROID) 32.5 MG tablet Take 32.5 mg by mouth daily.  .  (NORCO) 5-325 MG per tablet Take 1 tablet by mouth every 6 (six) hours as needed for moderate pain.  . ANUSOL-HC 2.5 % crm Apply rectally 2 times daily  . ANUSOL-HC 25 MG supp Place 1 suppository (25 mg total) rectally 2 (two) times daily.  . meloxicam  15 MG tablet Take one tablet daily with food   Allergies  Allergen Reactions  . Eggs Or Egg-Derived Products    Past Medical History  Diagnosis Date  . Allergy   . Hyperlipidemia   . Vitamin D deficiency   . Dysplastic nevus     left shoulder   Review of Systems  Constitutional: Positive for fever and fatigue. Negative for chills and appetite change.  HENT: Positive for congestion, ear pain, rhinorrhea, sinus  pressure, sore throat and trouble swallowing. Negative for drooling, ear discharge, facial swelling, hearing loss, hoarse voice, mouth sores, nosebleeds, postnasal drip, tinnitus and voice change.   Eyes: Negative.   Respiratory: Negative for apnea, cough, choking, chest tightness, shortness of breath, wheezing and stridor.   Cardiovascular: Negative.   Gastrointestinal: Negative.  Negative for vomiting and diarrhea.  Musculoskeletal: Negative for neck pain.  Neurological: Negative for headaches.   Objective:   Physical Exam  Constitutional: He is oriented to person, place, and time. No distress.  HENT:  Mouth/Throat: Oropharyngeal exudate present.  Posterior pharynx 2-3 (+) injected with bilat 2 (+) enlarged cryptic tonsils and with tonsillar exudates.  Eyes: Pupils are equal, round, and reactive to light. Right eye exhibits no discharge. Left eye exhibits discharge.  Neck: Normal range of motion. Neck supple. No JVD present. No thyromegaly present.  Cardiovascular: Normal rate, regular rhythm and normal heart sounds.   Pulmonary/Chest: No respiratory distress. He has no wheezes. He has no rales. He exhibits no tenderness.  Abdominal: Soft.  Musculoskeletal: Normal range of motion. He exhibits no edema.  Lymphadenopathy:    He has cervical adenopathy.  Neurological: He is alert and oriented to person, place, and time. No cranial nerve deficit. Coordination normal.  Skin: Skin is warm and dry. No rash noted. No erythema.  Assessment & Plan:   1. Acute pharyngitis, unspecified pharyngitis type  - predniSONE (DELTASONE) 20 MG tablet; 1 tab 3 x day for 3 days, then 1 tab 2 x day for 3 days, then 1 tab 1 x day for 5 days  Dispense: 20 tablet; Refill: 0 - azithromycin (ZITHROMAX) 250 MG tablet; Take 2 tablets (500 mg) on  Day 1,  followed by 1 tablet (250 mg) once daily on Days 2 through 5.  Dispense: 6 each; Refill: 1 - promethazine (PHENERGAN) 6.25 MG/5ML syrup; Take 1 to 2 tsp swish &  swallow every 3 to 4 hours as needed  Dispense: 473 mL; Refill: 0  2. Acute tonsillitis

## 2014-03-28 ENCOUNTER — Other Ambulatory Visit: Payer: Self-pay | Admitting: Physician Assistant

## 2014-04-21 ENCOUNTER — Encounter: Payer: Self-pay | Admitting: Emergency Medicine

## 2014-04-21 MED ORDER — NYSTATIN 100000 UNIT/GM EX POWD: CUTANEOUS | Status: DC

## 2014-05-03 ENCOUNTER — Encounter (HOSPITAL_COMMUNITY): Payer: Self-pay | Admitting: Emergency Medicine

## 2014-05-03 ENCOUNTER — Encounter: Payer: Self-pay | Admitting: *Deleted

## 2014-05-03 ENCOUNTER — Emergency Department (HOSPITAL_COMMUNITY)
Admission: EM | Admit: 2014-05-03 | Discharge: 2014-05-04 | Disposition: A | Discharge status: Home / Self Care | Payer: BC Managed Care – PPO | Attending: Emergency Medicine | Admitting: Emergency Medicine

## 2014-05-03 DIAGNOSIS — R1084 Generalized abdominal pain: Secondary | ICD-10-CM | POA: Diagnosis present

## 2014-05-03 DIAGNOSIS — Z862 Personal history of diseases of the blood and blood-forming organs and certain disorders involving the immune mechanism: Secondary | ICD-10-CM | POA: Diagnosis not present

## 2014-05-03 DIAGNOSIS — A084 Viral intestinal infection, unspecified: Secondary | ICD-10-CM | POA: Diagnosis not present

## 2014-05-03 DIAGNOSIS — E559 Vitamin D deficiency, unspecified: Secondary | ICD-10-CM | POA: Diagnosis not present

## 2014-05-03 DIAGNOSIS — Z79899 Other long term (current) drug therapy: Secondary | ICD-10-CM | POA: Diagnosis not present

## 2014-05-03 LAB — URINALYSIS, ROUTINE W REFLEX MICROSCOPIC
Glucose, UA: NEGATIVE mg/dL
Hgb urine dipstick: NEGATIVE
KETONES UR: 40 mg/dL — AB
LEUKOCYTES UA: NEGATIVE
NITRITE: NEGATIVE
PH: 7.5 (ref 5.0–8.0)
PROTEIN: NEGATIVE mg/dL
Specific Gravity, Urine: 1.025 (ref 1.005–1.030)
UROBILINOGEN UA: 0.2 mg/dL (ref 0.0–1.0)

## 2014-05-03 LAB — COMPREHENSIVE METABOLIC PANEL
ALT: 27 U/L (ref 0–53)
ANION GAP: 10 (ref 5–15)
AST: 32 U/L (ref 0–37)
Albumin: 4.4 g/dL (ref 3.5–5.2)
Alkaline Phosphatase: 107 U/L (ref 39–117)
BUN: 19 mg/dL (ref 6–23)
CALCIUM: 9.5 mg/dL (ref 8.4–10.5)
CO2: 24 mmol/L (ref 19–32)
CREATININE: 1.21 mg/dL (ref 0.50–1.35)
Chloride: 105 mEq/L (ref 96–112)
GFR calc Af Amer: 85 mL/min — ABNORMAL LOW (ref >90)
GFR, EST NON AFRICAN AMERICAN: 73 mL/min — AB (ref >90)
Glucose, Bld: 128 mg/dL — ABNORMAL HIGH (ref 70–99)
Potassium: 3.9 mmol/L (ref 3.5–5.1)
Sodium: 139 mmol/L (ref 135–145)
Total Bilirubin: 1.2 mg/dL (ref 0.3–1.2)
Total Protein: 7.6 g/dL (ref 6.0–8.3)

## 2014-05-03 LAB — LIPASE, BLOOD: LIPASE: 31 U/L (ref 11–59)

## 2014-05-03 LAB — CBC WITH DIFFERENTIAL/PLATELET
BASOS ABS: 0 10*3/uL (ref 0.0–0.1)
Basophils Relative: 0 % (ref 0–1)
EOS PCT: 0 % (ref 0–5)
Eosinophils Absolute: 0 10*3/uL (ref 0.0–0.7)
HCT: 51.4 % (ref 39.0–52.0)
Hemoglobin: 17.9 g/dL — ABNORMAL HIGH (ref 13.0–17.0)
LYMPHS PCT: 3 % — AB (ref 12–46)
Lymphs Abs: 0.4 10*3/uL — ABNORMAL LOW (ref 0.7–4.0)
MCH: 32.1 pg (ref 26.0–34.0)
MCHC: 34.8 g/dL (ref 30.0–36.0)
MCV: 92.3 fL (ref 78.0–100.0)
MONO ABS: 0.8 10*3/uL (ref 0.1–1.0)
MONOS PCT: 6 % (ref 3–12)
Neutro Abs: 12 10*3/uL — ABNORMAL HIGH (ref 1.7–7.7)
Neutrophils Relative %: 91 % — ABNORMAL HIGH (ref 43–77)
Platelets: 212 10*3/uL (ref 150–400)
RBC: 5.57 MIL/uL (ref 4.22–5.81)
RDW: 12.6 % (ref 11.5–15.5)
WBC: 13.3 10*3/uL — ABNORMAL HIGH (ref 4.0–10.5)

## 2014-05-03 MED ORDER — DICYCLOMINE HCL 10 MG PO CAPS
20.0000 mg | ORAL_CAPSULE | Freq: Once | ORAL | Status: AC
  Administered 2014-05-04: 20 mg via ORAL
  Filled 2014-05-03: qty 2

## 2014-05-03 MED ORDER — SODIUM CHLORIDE 0.9 % IV BOLUS (SEPSIS): 2000.0000 mL | Freq: Once | INTRAVENOUS | Status: DC

## 2014-05-03 MED ORDER — ONDANSETRON 8 MG PO TBDP
8.0000 mg | ORAL_TABLET | Freq: Once | ORAL | Status: AC
  Administered 2014-05-03: 8 mg via ORAL
  Filled 2014-05-03: qty 1

## 2014-05-03 MED ORDER — SODIUM CHLORIDE 0.9 % IV BOLUS (SEPSIS)
1000.0000 mL | Freq: Once | INTRAVENOUS | Status: AC
  Administered 2014-05-03: 1000 mL via INTRAVENOUS

## 2014-05-03 MED ORDER — METOCLOPRAMIDE HCL 5 MG/ML IJ SOLN
10.0000 mg | Freq: Once | INTRAMUSCULAR | Status: AC
  Administered 2014-05-03: 10 mg via INTRAVENOUS
  Filled 2014-05-03: qty 2

## 2014-05-03 NOTE — ED Notes (Signed)
Pt reports abdominal pain with n/v/d since 1800 with x12 episodes of v/d. Pt states that his mother had similar symptoms on 12/25. Pt actively vomiting in triage.

## 2014-05-03 NOTE — ED Provider Notes (Addendum)
CSN: [id number]     Arrival date & time 05/03/14  2050 History   First MD Initiated Contact with Patient 05/03/14 2255     Chief Complaint  Patient presents with  . Abdominal Pain     (Consider location/radiation/quality/duration/timing/severity/associated sxs/prior Treatment) HPI Kristopher Mendez is a 43 y.o. male with no significant past medical history coming in with abdominal pain. Patient states it began earlier today at 6 PM. Described as diffuse and cramping. He's had associated nausea vomiting and diarrhea which she states occurs every 20 minutes. He intermittently has chills as well. Patient has sick contacts and his mother and nephew who had similar symptoms yesterday. He woke up today feeling ill. He denies any blood in his emesis or stool. He has no urinary changes. He denies any recent illness or antibiotic use. Patient has no further complaints.  10 Systems reviewed and are negative for acute change except as noted in the HPI.     Past Medical History  Diagnosis Date  . Allergy   . Hyperlipidemia   . Vitamin D deficiency   . Dysplastic nevus     left shoulder   Past Surgical History  Procedure Laterality Date  . Shoulder surgery Right     spurs  . Skin cancer excision Left 12/2012    SHOULDER   Family History  Problem Relation Age of Onset  . Diabetes Father   . Hyperlipidemia Father   . Heart disease Father 43  . Hypertension Father   . Hypertension Other   . Heart disease Other   . Hyperlipidemia Other   . Hyperlipidemia Mother   . Heart disease Mother 51  . Hypertension Mother   . Heart disease Brother   . Hyperlipidemia Brother   . Hypertension Brother   . Cancer Maternal Grandmother     lymphoma   History  Substance Use Topics  . Smoking status: Never Smoker   . Smokeless tobacco: Never Used  . Alcohol Use: No    Review of Systems    Allergies  Eggs or egg-derived products  Home Medications   Prior to Admission medications    Medication Sig Start Date End Date Taking? Authorizing Provider  cetirizine (ZYRTEC) 10 MG tablet Take 10 mg by mouth daily.   Yes Historical Provider, MD  cholecalciferol (VITAMIN D) 1000 UNITS tablet Take 5,000 Units by mouth daily.   Yes Historical Provider, MD  cyclobenzaprine (FLEXERIL) 10 MG tablet Take 1/2 to 1 tablet 3 x / day as needed for muscle spasm Patient taking differently: Take 10 mg by mouth 3 (three) times daily as needed.  03/28/14  Yes Unk Pinto, MD  Magnesium 250 MG TABS Take by mouth 2 (two) times daily.   Yes Historical Provider, MD  Multiple Vitamins-Minerals (MULTIVITAMIN PO) Take by mouth.   Yes Historical Provider, MD  Omega-3 Fatty Acids (FISH OIL) 1000 MG CAPS Take by mouth.   Yes Historical Provider, MD  Probiotic Product (PROBIOTIC DAILY PO) Take by mouth.   Yes Historical Provider, MD  thyroid [facility] THYROID) 32.5 MG tablet Take 32.5 mg by mouth daily.   Yes Historical Provider, MD  azithromycin (ZITHROMAX) 250 MG tablet Take 2 tablets (500 mg) on  Day 1,  followed by 1 tablet (250 mg) once daily on Days 2 through 5. Patient not taking: Reported on 05/03/2014 03/13/14   Unk Pinto, MD  ketoconazole (NIZORAL) 2 % cream Apply 1 application topically daily. Patient not taking: Reported on 05/03/2014 07/03/13  Ardis Hughs, PA-C  meloxicam (MOBIC) 15 MG tablet  02/20/14   Historical Provider, MD  nystatin (MYCOSTATIN/NYSTOP) 100000 UNIT/GM POWD APPLY TWICE A DAY Patient not taking: Reported on 05/03/2014 04/21/14 04/22/15  Vicie Mutters, PA-C  predniSONE (DELTASONE) 20 MG tablet 1 tab 3 x day for 3 days, then 1 tab 2 x day for 3 days, then 1 tab 1 x day for 5 days Patient not taking: Reported on 05/03/2014 03/13/14   Unk Pinto, MD  Pregnenolone POWD 100 mg.    Historical Provider, MD  promethazine (PHENERGAN) 6.25 MG/5ML syrup Take 1 to 2 tsp swish & swallow every 3 to 4 hours as needed Patient not taking: Reported on 05/03/2014 03/13/14   Unk Pinto, MD   BP 105/73 mmHg  Pulse 118  Temp(Src) 98.9 F (37.2 C) (Oral)  Resp 22  Wt 235 lb (106.595 kg)  SpO2 100% Physical Exam  Constitutional: He is oriented to person, place, and time. Vital signs are normal. He appears well-developed and well-nourished.  Non-toxic appearance. He does not appear ill. No distress.  HENT:  Head: Normocephalic and atraumatic.  Nose: Nose normal.  Mouth/Throat: Oropharynx is clear and moist. No oropharyngeal exudate.  Eyes: Conjunctivae and EOM are normal. Pupils are equal, round, and reactive to light. No scleral icterus.  Neck: Normal range of motion. Neck supple. No tracheal deviation, no edema, no erythema and normal range of motion present. No thyroid mass and no thyromegaly present.  Cardiovascular: Normal rate, regular rhythm, S1 normal, S2 normal, normal heart sounds, intact distal pulses and normal pulses.  Exam reveals no gallop and no friction rub.   No murmur heard. Pulses:      Radial pulses are 2+ on the right side, and 2+ on the left side.       Dorsalis pedis pulses are 2+ on the right side, and 2+ on the left side.  Pulmonary/Chest: Effort normal and breath sounds normal. No respiratory distress. He has no wheezes. He has no rhonchi. He has no rales.  Abdominal: Soft. Normal appearance and bowel sounds are normal. He exhibits no distension, no ascites and no mass. There is no hepatosplenomegaly. There is no tenderness. There is no rebound, no guarding and no CVA tenderness.  Musculoskeletal: Normal range of motion. He exhibits no edema or tenderness.  Lymphadenopathy:    He has no cervical adenopathy.  Neurological: He is alert and oriented to person, place, and time. He has normal strength. No cranial nerve deficit or sensory deficit. He exhibits normal muscle tone. GCS eye subscore is 4. GCS verbal subscore is 5. GCS motor subscore is 6.  Skin: Skin is warm, dry and intact. No petechiae and no rash noted. He is not diaphoretic. No  erythema. No pallor.  Psychiatric: He has a normal mood and affect. His behavior is normal. Judgment normal.  Nursing note and vitals reviewed.   ED Course  Procedures (including critical care time) Labs Review Labs Reviewed  CBC WITH DIFFERENTIAL - Abnormal; Notable for the following:    WBC 13.3 (*)    Hemoglobin 17.9 (*)    Neutrophils Relative % 91 (*)    Neutro Abs 12.0 (*)    Lymphocytes Relative 3 (*)    Lymphs Abs 0.4 (*)    All other components within normal limits  COMPREHENSIVE METABOLIC PANEL - Abnormal; Notable for the following:    Glucose, Bld 128 (*)    GFR calc non Af Amer 73 (*)  GFR calc Af Amer 85 (*)    All other components within normal limits  URINALYSIS, ROUTINE W REFLEX MICROSCOPIC - Abnormal; Notable for the following:    Color, Urine AMBER (*)    Bilirubin Urine SMALL (*)    Ketones, ur 40 (*)    All other components within normal limits  LIPASE, BLOOD    Imaging Review No results found.   EKG Interpretation None      MDM   Final diagnoses:  None    Patient presents emergency department for crampy abdominal pain, vomiting, diarrhea. We have seen this many times in the emergency department over the last couple of days, likely consistent with gastroenteritis. He was given Zofran, Reglan, IV fluids for mild dehydration. Patient also given Bentyl.  Upon repeat assessment patient appears much more comfortable. He also states he feels better resting comfortably in bed. Tachycardia has resolved with a heart rate in the 80s upon my repeat exam. Will discharge home with Zofran and Bentyl. Primary care follow-up was advised within 3 days. His vital signs remain within his normal limits and he is safe for discharge.  Everlene Balls, MD 05/04/14 0037  Discharge vital signs reveal a temperature of 101. This remains consistent with patient's vital gastroenteritis. He was given 1 g of Tylenol prior to discharge. Patient was advised her primary care  follow-up within the next 3 days and return precautions given.  Everlene Balls, MD 05/04/14 [phone]

## 2014-05-03 NOTE — ED Notes (Signed)
No answer x1

## 2014-05-04 MED ORDER — DICYCLOMINE HCL 10 MG PO CAPS: 20.0000 mg | ORAL_CAPSULE | Freq: Two times a day (BID) | ORAL | Status: DC | PRN

## 2014-05-04 MED ORDER — METOCLOPRAMIDE HCL 10 MG PO TABS: 10.0000 mg | ORAL_TABLET | Freq: Two times a day (BID) | ORAL | Status: DC | PRN

## 2014-05-04 MED ORDER — ACETAMINOPHEN 500 MG PO TABS
ORAL_TABLET | ORAL | Status: AC
  Filled 2014-05-04: qty 2

## 2014-05-04 MED ORDER — ACETAMINOPHEN 500 MG PO TABS
1000.0000 mg | ORAL_TABLET | Freq: Once | ORAL | Status: AC
  Administered 2014-05-04: 1000 mg via ORAL

## 2014-05-04 NOTE — Discharge Instructions (Signed)
Viral Gastroenteritis Kristopher Mendez, he was seen today for abdominal pain vomiting and diarrhea. Your symptoms improved with medication, continue to take as prescribed. Your symptoms Should resolve over the next couple of days, follow-up with your primary care physician within 3 days for continued management. If symptoms worsen come back to emergency department immediately. Thank you. Viral gastroenteritis is also called stomach flu. This illness is caused by a certain type of germ (virus). It can cause sudden watery poop (diarrhea) and throwing up (vomiting). This can cause you to lose body fluids (dehydration). This illness usually lasts for 3 to 8 days. It usually goes away on its own. HOME CARE   Drink enough fluids to keep your pee (urine) clear or pale yellow. Drink small amounts of fluids often.  Ask your doctor how to replace body fluid losses (rehydration).  Avoid:  Foods high in sugar.  Alcohol.  Bubbly (carbonated) drinks.  Tobacco.  Juice.  Caffeine drinks.  Very hot or cold fluids.  Fatty, greasy foods.  Eating too much at one time.  Dairy products until 24 to 48 hours after your watery poop stops.  You may eat foods with active cultures (probiotics). They can be found in some yogurts and supplements.  Wash your hands well to avoid spreading the illness.  Only take medicines as told by your doctor. Do not give aspirin to children. Do not take medicines for watery poop (antidiarrheals).  Ask your doctor if you should keep taking your regular medicines.  Keep all doctor visits as told. GET HELP RIGHT AWAY IF:   You cannot keep fluids down.  You do not pee at least once every 6 to 8 hours.  You are short of breath.  You see blood in your poop or throw up. This may look like coffee grounds.  You have belly (abdominal) pain that gets worse or is just in one small spot (localized).  You keep throwing up or having watery poop.  You have a fever.  The patient  is a child younger than 3 months, and he or she has a fever.  The patient is a child older than 3 months, and he or she has a fever and problems that do not go away.  The patient is a child older than 3 months, and he or she has a fever and problems that suddenly get worse.  The patient is a baby, and he or she has no tears when crying. MAKE SURE YOU:   Understand these instructions.  Will watch your condition.  Will get help right away if you are not doing well or get worse. Document Released: 10/12/2007 Document Revised: 07/18/2011 Document Reviewed: 02/09/2011 [facility] Patient Information 2015 [place], [place]. This information is not intended to replace advice given to you by your health care provider. Make sure you discuss any questions you have with your health care provider.

## 2014-05-22 ENCOUNTER — Encounter: Payer: Self-pay | Admitting: Physician Assistant

## 2014-05-22 ENCOUNTER — Encounter: Payer: Self-pay | Admitting: Emergency Medicine

## 2014-05-22 ENCOUNTER — Telehealth: Payer: BC Managed Care – PPO | Admitting: Physician Assistant

## 2014-05-22 DIAGNOSIS — J029 Acute pharyngitis, unspecified: Secondary | ICD-10-CM

## 2014-05-22 DIAGNOSIS — J039 Acute tonsillitis, unspecified: Secondary | ICD-10-CM

## 2014-05-22 DIAGNOSIS — J069 Acute upper respiratory infection, unspecified: Secondary | ICD-10-CM

## 2014-05-22 MED ORDER — PREDNISONE 20 MG PO TABS: ORAL_TABLET | ORAL | Status: DC

## 2014-05-22 MED ORDER — AZITHROMYCIN 250 MG PO TABS: ORAL_TABLET | ORAL | Status: DC

## 2014-05-22 NOTE — Progress Notes (Signed)
[facility] E-Visit Sinus 1    Question   05/22/2014 4:52 PM   Which of the following have you been experiencing?  Congested nose     Ear pain     Cough   Have you had any of the following?  None of the above   How long have you been having these symptoms?  For a few days   Do you have a fever?  No, I do not have a fever   Do you smoke?  No   Have you ever smoked?  I have never smoked     Do you have any chronic illnesses, such as diabetes, heart disease, or lung disease, or any illness that would weaken your body's ability to fight infection?  No   When you blow your nose, what color is the mucus?  Mostly clear   Have you experienced similar problems in the past?  Yes   What treatments have worked in the past? What has not worked?  Z pac   Is this illness similar to previous illnesses you have had? How is it the same? How is it different?  I've always had sinus issue. MAJOR difference know is that my throat is completely hoarse. Just happened all of a sudden. This has never happened and started Monday.    Have you recently been hospitalized?  Yes   Please enter a few details about when and why you were hospitalized.  Dec 26 for viral gastroenteritis    What medications are you currently taking for these symptoms?  Nothing   Please list your medication allegies that you may have ? (If 'none' , please list as 'none')  Flu shot   Please list the pharmacy name and location you would like to use for this E Visit  [facility] [address] / [address]   Patient has been having sore throat, laryngitis, congestion, cough for a few days, no treatments tried. No fever, chills. Will send in prednisone and zpak but tell him to hold the zpak.

## 2014-06-11 ENCOUNTER — Encounter: Payer: Self-pay | Admitting: Emergency Medicine

## 2014-06-17 ENCOUNTER — Encounter: Payer: Self-pay | Admitting: Emergency Medicine

## 2014-06-18 ENCOUNTER — Telehealth: Payer: Self-pay | Admitting: Internal Medicine

## 2014-06-18 MED ORDER — NYSTATIN 100000 UNIT/GM EX POWD: CUTANEOUS | Status: DC

## 2014-06-18 NOTE — Telephone Encounter (Signed)
[place]

## 2014-07-03 ENCOUNTER — Encounter: Payer: Self-pay | Admitting: Emergency Medicine

## 2014-07-08 ENCOUNTER — Ambulatory Visit (INDEPENDENT_AMBULATORY_CARE_PROVIDER_SITE_OTHER): Payer: BC Managed Care – PPO | Admitting: Emergency Medicine

## 2014-07-08 ENCOUNTER — Encounter: Payer: Self-pay | Admitting: Emergency Medicine

## 2014-07-08 VITALS — BP 136/92 | HR 66 | Temp 98.0°F | Resp 18 | Ht 72.5 in | Wt 235.0 lb

## 2014-07-08 DIAGNOSIS — R6889 Other general symptoms and signs: Secondary | ICD-10-CM

## 2014-07-08 DIAGNOSIS — IMO0001 Reserved for inherently not codable concepts without codable children: Secondary | ICD-10-CM

## 2014-07-08 DIAGNOSIS — E559 Vitamin D deficiency, unspecified: Secondary | ICD-10-CM

## 2014-07-08 DIAGNOSIS — E782 Mixed hyperlipidemia: Secondary | ICD-10-CM

## 2014-07-08 DIAGNOSIS — Z0001 Encounter for general adult medical examination with abnormal findings: Secondary | ICD-10-CM

## 2014-07-08 DIAGNOSIS — R21 Rash and other nonspecific skin eruption: Secondary | ICD-10-CM

## 2014-07-08 DIAGNOSIS — R030 Elevated blood-pressure reading, without diagnosis of hypertension: Secondary | ICD-10-CM

## 2014-07-08 DIAGNOSIS — Z0000 Encounter for general adult medical examination without abnormal findings: Secondary | ICD-10-CM

## 2014-07-08 DIAGNOSIS — E785 Hyperlipidemia, unspecified: Secondary | ICD-10-CM

## 2014-07-08 DIAGNOSIS — Z125 Encounter for screening for malignant neoplasm of prostate: Secondary | ICD-10-CM

## 2014-07-08 DIAGNOSIS — Z79899 Other long term (current) drug therapy: Secondary | ICD-10-CM

## 2014-07-08 DIAGNOSIS — J302 Other seasonal allergic rhinitis: Secondary | ICD-10-CM

## 2014-07-08 DIAGNOSIS — Z111 Encounter for screening for respiratory tuberculosis: Secondary | ICD-10-CM

## 2014-07-08 DIAGNOSIS — Z1212 Encounter for screening for malignant neoplasm of rectum: Secondary | ICD-10-CM

## 2014-07-08 LAB — CBC WITH DIFFERENTIAL/PLATELET
Basophils Absolute: 0 10*3/uL (ref 0.0–0.1)
Basophils Relative: 0 % (ref 0–1)
EOS PCT: 1 % (ref 0–5)
Eosinophils Absolute: 0.1 10*3/uL (ref 0.0–0.7)
HCT: 46.2 % (ref 39.0–52.0)
HEMOGLOBIN: 16.2 g/dL (ref 13.0–17.0)
LYMPHS ABS: 2.9 10*3/uL (ref 0.7–4.0)
LYMPHS PCT: 42 % (ref 12–46)
MCH: 32.6 pg (ref 26.0–34.0)
MCHC: 35.1 g/dL (ref 30.0–36.0)
MCV: 93 fL (ref 78.0–100.0)
MPV: 9.8 fL (ref 8.6–12.4)
Monocytes Absolute: 0.8 10*3/uL (ref 0.1–1.0)
Monocytes Relative: 12 % (ref 3–12)
Neutro Abs: 3.2 10*3/uL (ref 1.7–7.7)
Neutrophils Relative %: 45 % (ref 43–77)
Platelets: 207 10*3/uL (ref 150–400)
RBC: 4.97 MIL/uL (ref 4.22–5.81)
RDW: 12.6 % (ref 11.5–15.5)
WBC: 7 10*3/uL (ref 4.0–10.5)

## 2014-07-08 MED ORDER — ROSUVASTATIN CALCIUM 5 MG PO TABS: 5.0000 mg | ORAL_TABLET | Freq: Every day | ORAL | Status: DC

## 2014-07-08 MED ORDER — MOMETASONE FUROATE 50 MCG/ACT NA SUSP: 1.0000 | Freq: Every day | NASAL | Status: DC

## 2014-07-08 NOTE — Progress Notes (Signed)
Subjective:    Patient ID: Kristopher Mendez, male    DOB: 1970/07/04, 44 y.o.   MRN: [id number]  HPI Comments: 44 yo WM CPE and Cholesterol f/u. He has had more recent labs done at Dr Boris Sharper Q 6  Months with labs normal except cholesterol.  He is working out routinely but not as hard as previous. He is eating healthy but not as healthy as previously. He notes occasional burning in chest after workout. He prefers NO CHOLESTEROL RX despite genetic history with heart disease. He does not have HTN history and does not routinely check BP but notes normally good.   He is a little stressed with partners recent family problems but feels he is dealing with the stress better.  He notes increased sinus drainage since FRI and added mucinex D without relief. He notes increased ear pressure and sneezing.   He still has groin and foot rash DX by DERM as fungal rash. He does note increased symptoms with increased sugar intake. He had improved symptoms with Lamisil and Nystatin RX combo. He also notes nail fungus has continued but had improved originally.    He had hemorrhoidectomy last year and denies any recurrence or issues with bowels.  Lab Results      Component                Value               Date                      WBC                      13.3*               05/03/2014                HGB                      17.9*               05/03/2014                HCT                      51.4                05/03/2014                PLT                      212                 05/03/2014                GLUCOSE                  128*                05/03/2014                CHOL                     188                 07/01/2013                TRIG  46                  07/01/2013                HDL                      49                  07/01/2013                LDLCALC                  130*                07/01/2013                ALT                      27                  05/03/2014                 AST                      32                  05/03/2014                NA                       139                 05/03/2014                K                        3.9                 05/03/2014                CL                       105                 05/03/2014                CREATININE               1.21                05/03/2014                BUN                      19                  05/03/2014                CO2                      24                  05/03/2014                TSH  1.823               07/01/2013                PSA                      1.44                07/01/2013                MICROALBUR               0.50                07/01/2013               Medication List       This list is accurate as of: 07/08/14 10:31 PM.  Always use your most recent med list.               cetirizine 10 MG tablet  Commonly known as:  ZYRTEC  Take 10 mg by mouth daily.     cholecalciferol 1000 UNITS tablet  Commonly known as:  VITAMIN D  Take 5,000 Units by mouth daily.     Fish Oil 1000 MG Caps  Take by mouth.     Magnesium 250 MG Tabs  Take by mouth 2 (two) times daily.     mometasone 50 MCG/ACT nasal spray  Commonly known as:  NASONEX  Place 1-2 sprays into the nose daily.     MUCINEX D PO  Take by mouth every 12 (twelve) hours.     MULTIVITAMIN PO  Take by mouth.     nystatin 100000 UNIT/GM Powd  APPLY TWICE A DAY     Pregnenolone Powd  100 mg.     PROBIOTIC DAILY PO  Take by mouth.     rosuvastatin 5 MG tablet  Commonly known as:  CRESTOR  Take 1 tablet (5 mg total) by mouth at bedtime.     WP THYROID 32.5 MG tablet  Generic drug:  thyroid  Take 32.5 mg by mouth daily.        Allergies  Allergen Reactions  . Eggs Or Egg-Derived Products Nausea Only    Allergic to yoke    Past Medical History  Diagnosis Date  . Allergy   . Hyperlipidemia   . Vitamin D deficiency   . Dysplastic nevus     left  shoulder  . Hemorrhoid 2015   Past Surgical History  Procedure Laterality Date  . Shoulder surgery Right     spurs  . Skin cancer excision Left 12/2012    SHOULDER  . Hemorroidectomy  2015   History  Substance Use Topics  . Smoking status: Never Smoker   . Smokeless tobacco: Never Used  . Alcohol Use: No   Family History  Problem Relation Age of Onset  . Diabetes Father   . Hyperlipidemia Father   . Heart disease Father 58  . Hypertension Father   . Hypertension Other   . Heart disease Other   . Hyperlipidemia Other   . Hyperlipidemia Mother   . Heart disease Mother 50  . Hypertension Mother   . Heart disease Brother   . Hyperlipidemia Brother   . Hypertension Brother   . Cancer Maternal Grandmother     lymphoma   MAINTENANCE: EYE:05/2014 WNL contacts Dentist:05/2014 WNL Q 6 month  IMMUNIZATIONS: VFIE:[id number] Pneumovax:n/a Zostavax:n/a Influenza:?  Patient Care Team: Unk Pinto,  MD as PCP - General (Internal Medicine) Jannette Spanner, MD as Referring Physician (Dermatology) Arnetha Gula, MD as Consulting Physician (Family Medicine) Hessie Dibble, MD as Consulting Physician (Orthopedic Surgery) Hessie Dibble, MD as Consulting Physician (Orthopedic Surgery) Hassell Done, [facility]) Wynetta Emery, (Dentist) [facility]   Review of Systems  Constitutional: Positive for fatigue.  Respiratory: Negative for cough and shortness of breath.   Cardiovascular: Negative for chest pain.  Gastrointestinal: Negative for blood in stool and anal bleeding.  Skin: Positive for rash.  Psychiatric/Behavioral: Negative for suicidal ideas and sleep disturbance.  All other systems reviewed and are negative.     BP 136/92 mmHg  Pulse 66  Temp(Src) 98 F (36.7 C) (Temporal)  Resp 18  Ht 6' 0.5" (1.842 m)  Wt 235 lb (106.595 kg)  BMI 31.42 kg/m2  Objective:   Physical Exam  Constitutional: He is oriented to person, place, and time. He appears well-developed and  well-nourished.  HENT:  Head: Normocephalic and atraumatic.  Right Ear: External ear normal.  Left Ear: External ear normal.  Nose: Nose normal.  Mouth/Throat: Oropharynx is clear and moist.  Eyes: Conjunctivae and EOM are normal. Pupils are equal, round, and reactive to light. No scleral icterus.  Neck: Normal range of motion. Neck supple. No JVD present. No tracheal deviation present. No thyromegaly present.  Cardiovascular: Normal rate, regular rhythm, normal heart sounds and intact distal pulses.   Pulmonary/Chest: Effort normal and breath sounds normal.  Abdominal: Soft. Bowel sounds are normal. He exhibits no distension and no mass. There is no tenderness. There is no rebound and no guarding.  Genitourinary:  DEF GI with recent hemorrhoid repair  Musculoskeletal: Normal range of motion. He exhibits no edema or tenderness.  Lymphadenopathy:    He has no cervical adenopathy.  Neurological: He is alert and oriented to person, place, and time. He has normal reflexes. No cranial nerve deficit. He exhibits normal muscle tone. Coordination normal.  Skin: Skin is warm and dry. Rash noted. No erythema. No pallor.     Bilateral thick/ chipped toe nails  Psychiatric: He has a normal mood and affect. His behavior is normal. Judgment and thought content normal.  Nursing note and vitals reviewed.    EKG NSCSPT WNL      Assessment & Plan:  1. CPE- Update screening labs/ History/ Immunizations/ Testing as needed. Advised healthy diet, QD exercise, increase H20 and continue RX/ Vitamins AD.   2. Allergic rhinitis- Zyrtec/ Allegra OTC, increase H2o, allergy hygiene explained.  Nasonex SX given 1 sp each nostril AD.  if CBC elevated will send in antibiotic  3. Cholesterol- recheck labs, Need to eat healthier and exercise AD. Long discussion about FHX and cholesterol and need for RX to decrease risk. Will try Crestor 5 mg #14 SX given, check labs to determine dosing needed to be called in for  Refill/ RX and advised will need 6 week lab only recheck. Advised add CoQ10 and take with RX in evening.  4. ? Rash/ fungus- Advised probable fungal with improvement in past with Lamisil and Nystatin, advised to continue nystatin and to try to decrease sugar in diet. Patient aware of hygiene and will f/u if symptoms increase  5. Elevated BP w/o DX of HTN vs stress- Check BP call if >130/80, increase cardio. Advised decrease stress, w/c if SX increase or ER.

## 2014-07-08 NOTE — Patient Instructions (Signed)

## 2014-07-09 ENCOUNTER — Other Ambulatory Visit: Payer: Self-pay | Admitting: Emergency Medicine

## 2014-07-09 LAB — LIPID PANEL
CHOL/HDL RATIO: 4.4 ratio
CHOLESTEROL: 169 mg/dL (ref 0–200)
HDL: 38 mg/dL — AB (ref >=40)
LDL Cholesterol: 117 mg/dL — ABNORMAL HIGH (ref 0–99)
Triglycerides: 68 mg/dL (ref <150)
VLDL: 14 mg/dL (ref 0–40)

## 2014-07-09 LAB — URINALYSIS, ROUTINE W REFLEX MICROSCOPIC
BILIRUBIN URINE: NEGATIVE
GLUCOSE, UA: NEGATIVE mg/dL
Hgb urine dipstick: NEGATIVE
Ketones, ur: NEGATIVE mg/dL
LEUKOCYTES UA: NEGATIVE
NITRITE: NEGATIVE
Protein, ur: NEGATIVE mg/dL
Specific Gravity, Urine: 1.006 (ref 1.005–1.030)
Urobilinogen, UA: 0.2 mg/dL (ref 0.0–1.0)
pH: 6 (ref 5.0–8.0)

## 2014-07-09 LAB — BASIC METABOLIC PANEL WITH GFR
BUN: 13 mg/dL (ref 6–23)
CALCIUM: 9.1 mg/dL (ref 8.4–10.5)
CO2: 29 meq/L (ref 19–32)
CREATININE: 0.98 mg/dL (ref 0.50–1.35)
Chloride: 101 mEq/L (ref 96–112)
GFR, Est Non African American: >89 mL/min
GLUCOSE: 91 mg/dL (ref 70–99)
Potassium: 3.9 mEq/L (ref 3.5–5.3)
Sodium: 138 mEq/L (ref 135–145)

## 2014-07-09 LAB — HEPATIC FUNCTION PANEL
ALBUMIN: 4.1 g/dL (ref 3.5–5.2)
ALK PHOS: 124 U/L — AB (ref 39–117)
ALT: 21 U/L (ref 0–53)
AST: 17 U/L (ref 0–37)
BILIRUBIN INDIRECT: 0.3 mg/dL (ref 0.2–1.2)
BILIRUBIN TOTAL: 0.4 mg/dL (ref 0.2–1.2)
Bilirubin, Direct: 0.1 mg/dL (ref 0.0–0.3)
TOTAL PROTEIN: 6.9 g/dL (ref 6.0–8.3)

## 2014-07-09 LAB — HEMOGLOBIN A1C
HEMOGLOBIN A1C: 5.6 % (ref <5.7)
MEAN PLASMA GLUCOSE: 114 mg/dL (ref <117)

## 2014-07-09 LAB — TSH: TSH: 1.429 u[IU]/mL (ref 0.350–4.500)

## 2014-07-09 LAB — MAGNESIUM: MAGNESIUM: 2.1 mg/dL (ref 1.5–2.5)

## 2014-07-09 LAB — PSA: PSA: 1.65 ng/mL (ref <=4.00)

## 2014-07-09 LAB — INSULIN, FASTING: INSULIN FASTING, SERUM: 7.2 u[IU]/mL (ref 2.0–19.6)

## 2014-07-09 LAB — VITAMIN D 25 HYDROXY (VIT D DEFICIENCY, FRACTURES): VIT D 25 HYDROXY: 60 ng/mL (ref 30–100)

## 2014-07-09 MED ORDER — ROSUVASTATIN CALCIUM 10 MG PO TABS: 10.0000 mg | ORAL_TABLET | Freq: Every day | ORAL | Status: DC

## 2014-07-11 LAB — TB SKIN TEST
INDURATION: 0 mm
TB Skin Test: NEGATIVE

## 2014-07-22 ENCOUNTER — Encounter: Payer: Self-pay | Admitting: Emergency Medicine

## 2014-08-20 ENCOUNTER — Other Ambulatory Visit: Payer: Self-pay | Admitting: Internal Medicine

## 2014-08-20 ENCOUNTER — Other Ambulatory Visit: Payer: BC Managed Care – PPO

## 2014-08-20 DIAGNOSIS — E782 Mixed hyperlipidemia: Secondary | ICD-10-CM

## 2014-08-20 DIAGNOSIS — Z79899 Other long term (current) drug therapy: Secondary | ICD-10-CM

## 2014-08-20 LAB — HEPATIC FUNCTION PANEL
ALBUMIN: 3.9 g/dL (ref 3.5–5.2)
ALT: 29 U/L (ref 0–53)
AST: 22 U/L (ref 0–37)
Alkaline Phosphatase: 101 U/L (ref 39–117)
BILIRUBIN TOTAL: 0.4 mg/dL (ref 0.2–1.2)
Bilirubin, Direct: 0.1 mg/dL (ref 0.0–0.3)
Indirect Bilirubin: 0.3 mg/dL (ref 0.2–1.2)
Total Protein: 6.2 g/dL (ref 6.0–8.3)

## 2014-08-20 LAB — LIPID PANEL
Cholesterol: 125 mg/dL (ref 0–200)
HDL: 33 mg/dL — AB (ref >=40)
LDL CALC: 71 mg/dL (ref 0–99)
Total CHOL/HDL Ratio: 3.8 Ratio
Triglycerides: 103 mg/dL (ref <150)
VLDL: 21 mg/dL (ref 0–40)

## 2014-08-22 ENCOUNTER — Encounter: Payer: Self-pay | Admitting: Emergency Medicine

## 2014-08-22 ENCOUNTER — Other Ambulatory Visit: Payer: Self-pay | Admitting: Physician Assistant

## 2014-08-22 MED ORDER — ROSUVASTATIN CALCIUM 10 MG PO TABS: 10.0000 mg | ORAL_TABLET | Freq: Every day | ORAL | Status: DC

## 2014-09-25 IMAGING — US US ABDOMEN COMPLETE
1 series · 14 of 25 positions shown · non-contrast
Comparison: None.

CLINICAL DATA: Elevated alkaline phosphatase

EXAM:
ULTRASOUND ABDOMEN COMPLETE

[Series 1: us abdomen complete · 0.32mm/px · 14 of 74 slices shown]
[im 1/74]
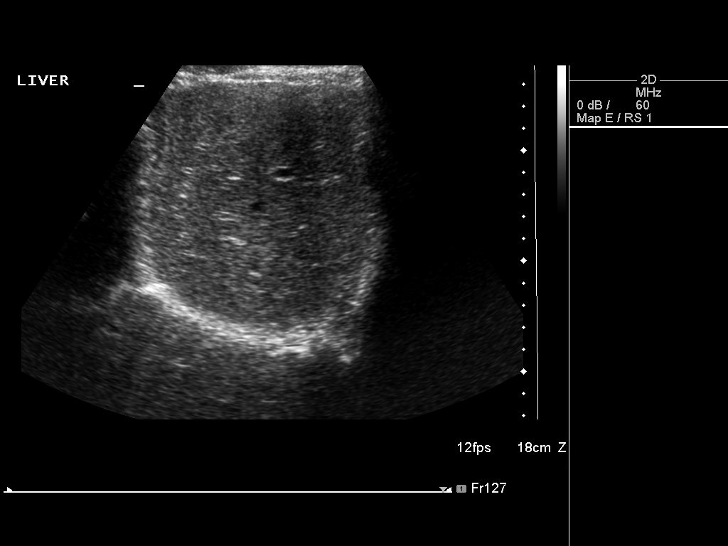
[im 7/74]
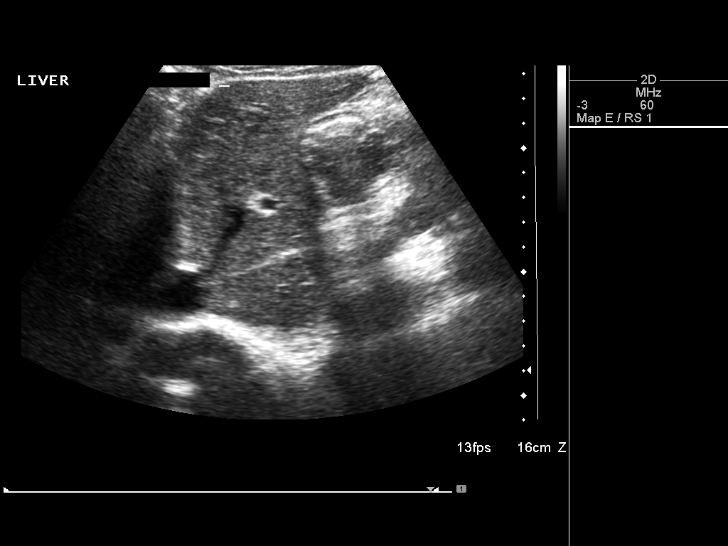
[im 13/74]
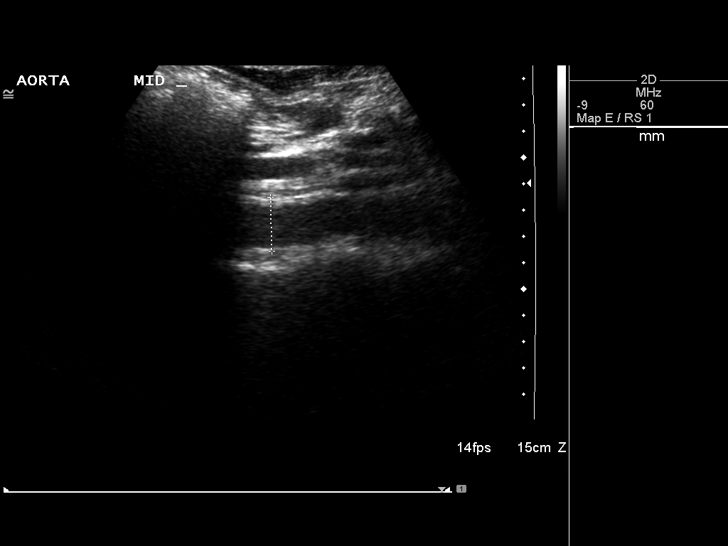
[im 19/74]
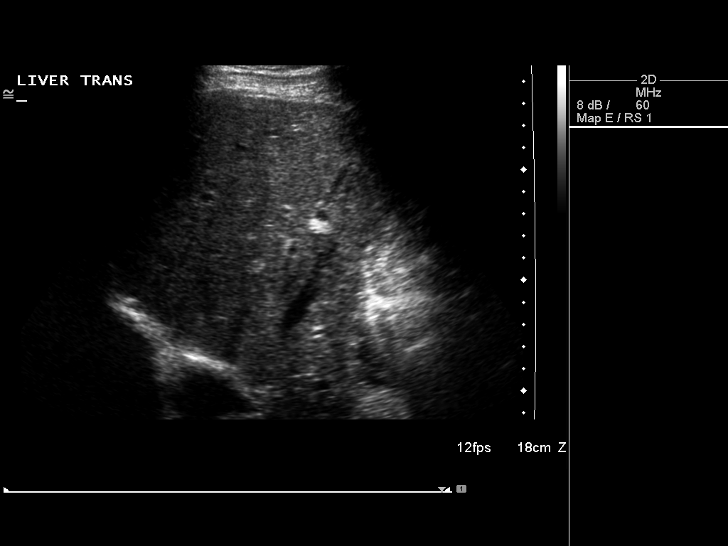
[im 25/74]
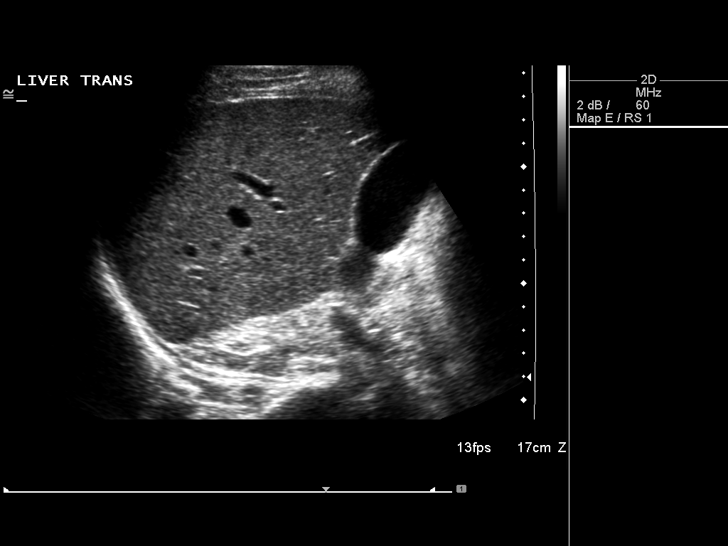
[im 28/74]
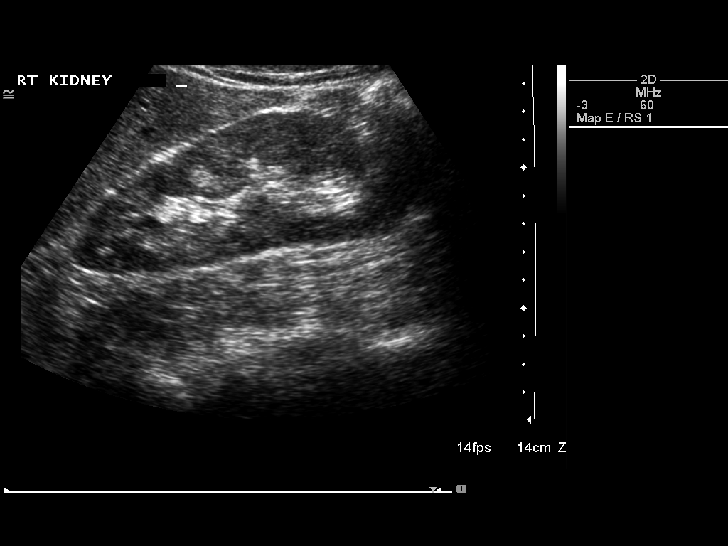
[im 34/74]
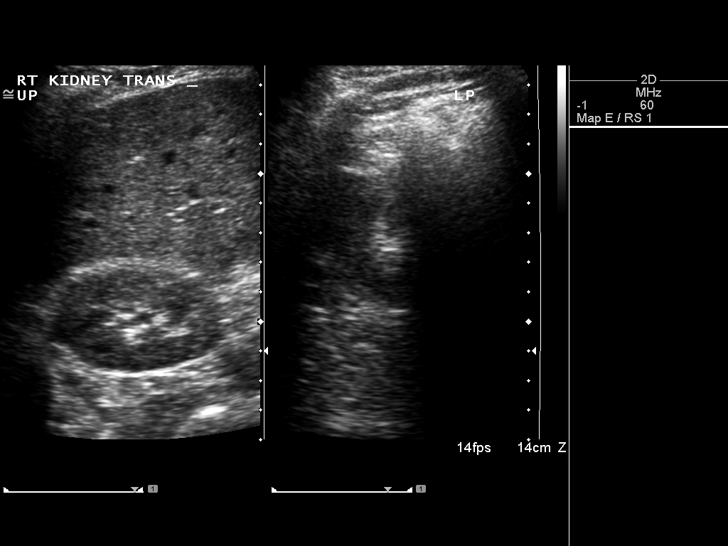
[im 40/74]
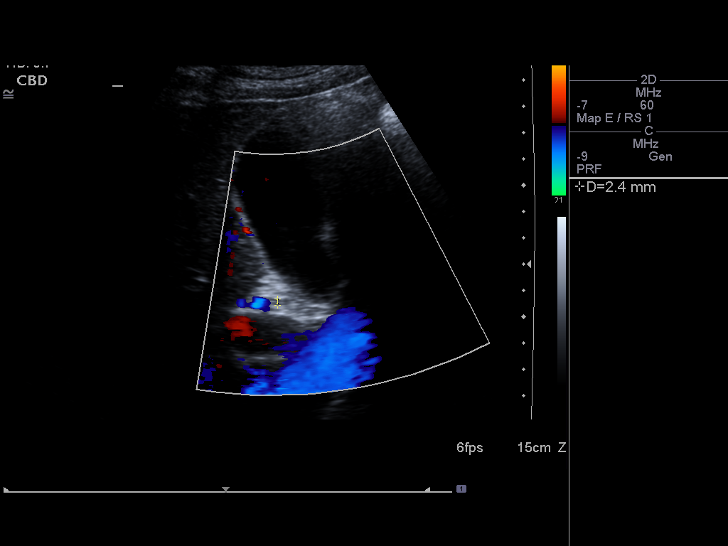
[im 46/74]
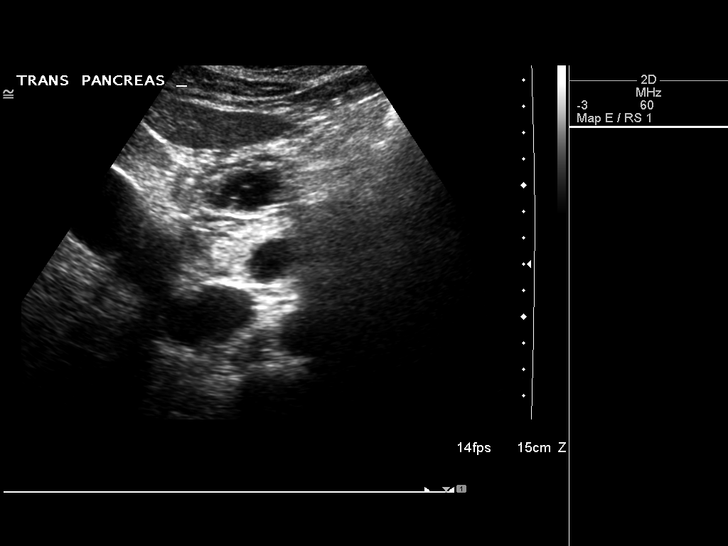
[im 49/74]
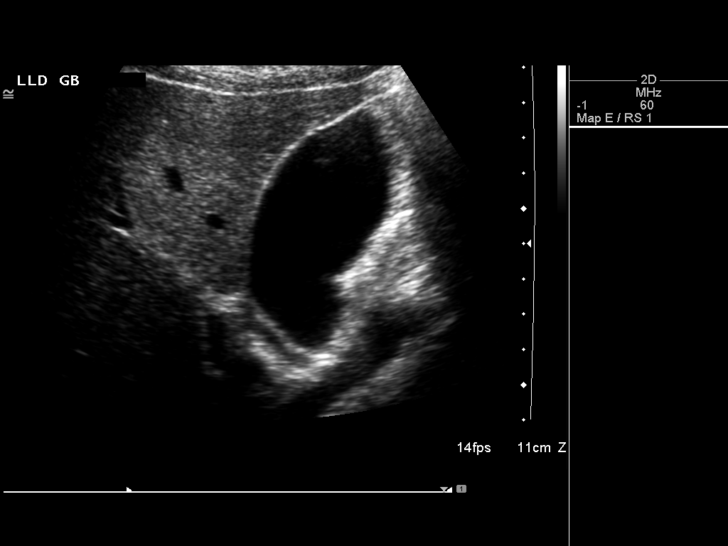
[im 55/74]
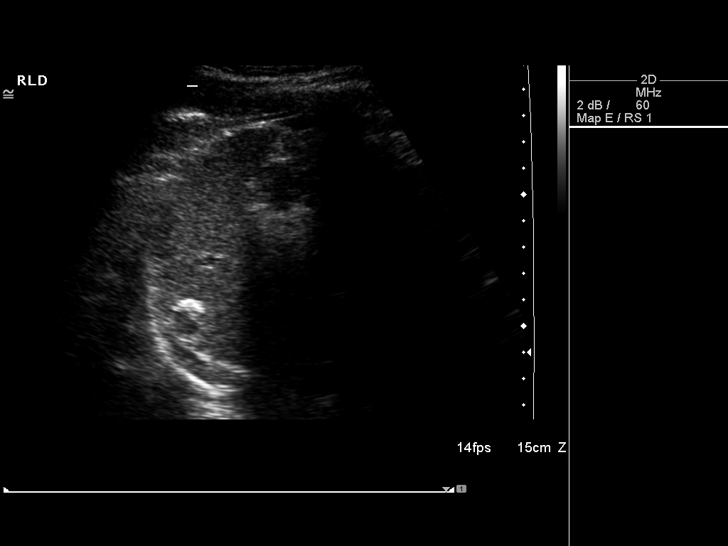
[im 61/74]
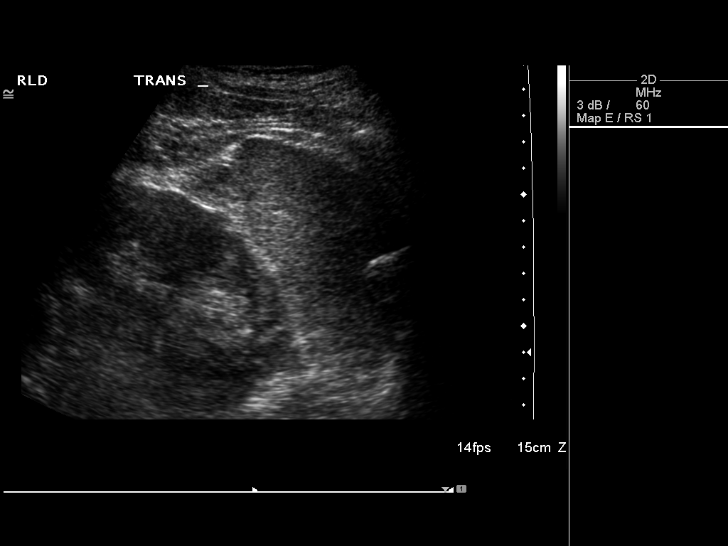
[im 67/74]
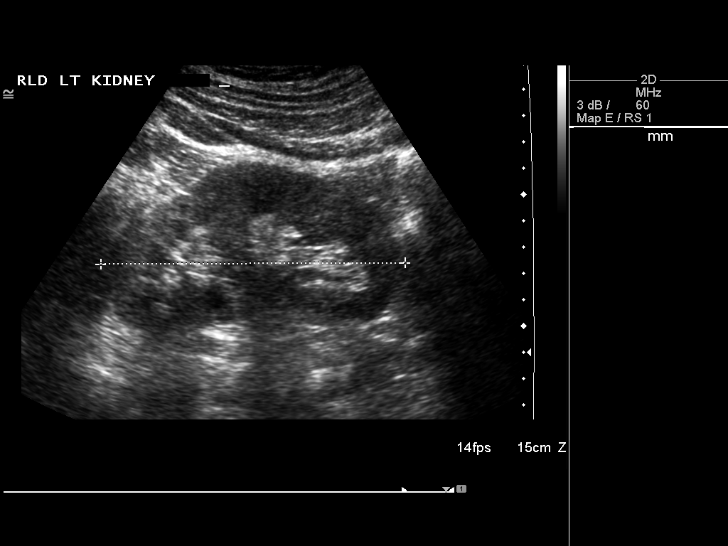
[im 74/74]
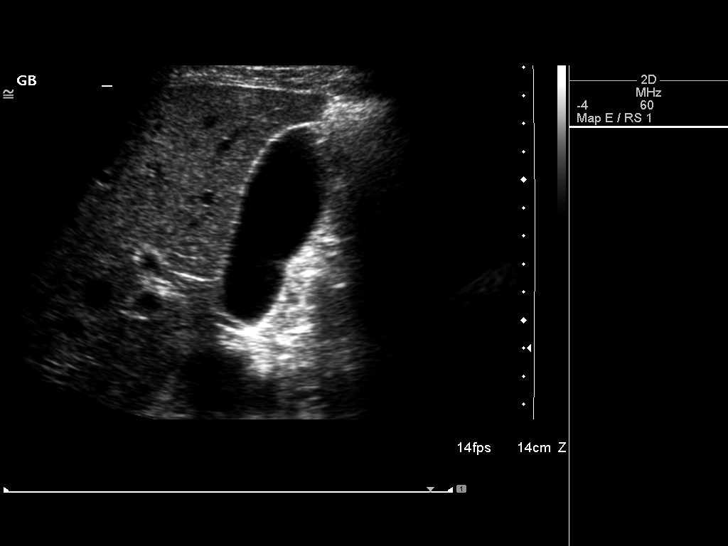

[14 of 25 positions shown; findings below may reference images not displayed]

FINDINGS: Gallbladder

The gallbladder is visualized and no gallstones are noted. There is
no pain over the gallbladder with compression.

Common bile duct

Diameter: The common bile duct is normal measuring 2.0 mm in
diameter.

Liver

The liver has a normal echogenic pattern. No focal abnormality is
seen.

IVC

No abnormality visualized.

Pancreas

The pancreas is largely obscured by bowel gas and cannot be
evaluated.

Spleen

The spleen measures 5.1 cm sagittally. There is an echogenic focus
within the spleen of 1.5 x 1.9 x 1.6 cm with calcified rim of
doubtful clinical significant.

Right Kidney

Length: The right kidney measures 13.8 cm.. No hydronephrosis is
seen.

Left Kidney

Length: The left kidney measures 11.6 cm.. No hydronephrosis is
noted.

Abdominal aorta

The abdominal aorta is normal caliber.
IMPRESSION: 1. No gallstones. No ductal dilatation.
2. The liver has a normal echogenic pattern. .
3. The pancreas is obscured by bowel gas.
4. Small focus within the spleen with calcified rim of doubtful
significance.

## 2014-11-16 ENCOUNTER — Encounter: Payer: Self-pay | Admitting: Emergency Medicine

## 2014-11-16 ENCOUNTER — Other Ambulatory Visit: Payer: Self-pay | Admitting: Internal Medicine

## 2014-11-16 DIAGNOSIS — L22 Diaper dermatitis: Principal | ICD-10-CM

## 2014-11-16 DIAGNOSIS — B372 Candidiasis of skin and nail: Secondary | ICD-10-CM

## 2014-11-16 MED ORDER — NYSTATIN 100000 UNIT/GM EX POWD: CUTANEOUS | Status: DC

## 2014-11-25 ENCOUNTER — Encounter: Payer: Self-pay | Admitting: Emergency Medicine

## 2014-11-25 ENCOUNTER — Other Ambulatory Visit: Payer: Self-pay | Admitting: Physician Assistant

## 2014-11-25 DIAGNOSIS — B351 Tinea unguium: Secondary | ICD-10-CM

## 2014-11-25 MED ORDER — CLOTRIMAZOLE 1 % EX CREA: 1.0000 "application " | TOPICAL_CREAM | Freq: Two times a day (BID) | CUTANEOUS | Status: DC

## 2014-11-27 ENCOUNTER — Ambulatory Visit (INDEPENDENT_AMBULATORY_CARE_PROVIDER_SITE_OTHER): Payer: BC Managed Care – PPO | Admitting: Internal Medicine

## 2014-11-27 ENCOUNTER — Encounter: Payer: Self-pay | Admitting: Internal Medicine

## 2014-11-27 VITALS — BP 122/60 | HR 88 | Temp 98.2°F | Resp 18 | Ht 72.5 in | Wt 230.0 lb

## 2014-11-27 DIAGNOSIS — R030 Elevated blood-pressure reading, without diagnosis of hypertension: Secondary | ICD-10-CM

## 2014-11-27 DIAGNOSIS — R739 Hyperglycemia, unspecified: Secondary | ICD-10-CM

## 2014-11-27 DIAGNOSIS — IMO0001 Reserved for inherently not codable concepts without codable children: Secondary | ICD-10-CM

## 2014-11-27 DIAGNOSIS — E782 Mixed hyperlipidemia: Secondary | ICD-10-CM

## 2014-11-27 DIAGNOSIS — E559 Vitamin D deficiency, unspecified: Secondary | ICD-10-CM

## 2014-11-27 DIAGNOSIS — E663 Overweight: Secondary | ICD-10-CM

## 2014-11-27 DIAGNOSIS — R7309 Other abnormal glucose: Secondary | ICD-10-CM

## 2014-11-27 DIAGNOSIS — Z79899 Other long term (current) drug therapy: Secondary | ICD-10-CM

## 2014-11-27 LAB — BASIC METABOLIC PANEL WITH GFR
BUN: 11 mg/dL (ref 6–23)
CALCIUM: 9.9 mg/dL (ref 8.4–10.5)
CHLORIDE: 103 meq/L (ref 96–112)
CO2: 26 mEq/L (ref 19–32)
Creat: 1.02 mg/dL (ref 0.50–1.35)
GFR, Est African American: >89 mL/min
GLUCOSE: 83 mg/dL (ref 70–99)
POTASSIUM: 4.6 meq/L (ref 3.5–5.3)
Sodium: 143 mEq/L (ref 135–145)

## 2014-11-27 LAB — HEPATIC FUNCTION PANEL
ALT: 28 U/L (ref 0–53)
AST: 22 U/L (ref 0–37)
Albumin: 3.9 g/dL (ref 3.5–5.2)
Alkaline Phosphatase: 108 U/L (ref 39–117)
BILIRUBIN TOTAL: 0.7 mg/dL (ref 0.2–1.2)
Bilirubin, Direct: 0.1 mg/dL (ref 0.0–0.3)
Indirect Bilirubin: 0.6 mg/dL (ref 0.2–1.2)
Total Protein: 6.8 g/dL (ref 6.0–8.3)

## 2014-11-27 LAB — CBC WITH DIFFERENTIAL/PLATELET
BASOS ABS: 0.1 10*3/uL (ref 0.0–0.1)
BASOS PCT: 1 % (ref 0–1)
EOS PCT: 1 % (ref 0–5)
Eosinophils Absolute: 0.1 10*3/uL (ref 0.0–0.7)
HEMATOCRIT: 46.8 % (ref 39.0–52.0)
Hemoglobin: 16 g/dL (ref 13.0–17.0)
LYMPHS PCT: 34 % (ref 12–46)
Lymphs Abs: 2.1 10*3/uL (ref 0.7–4.0)
MCH: 32.1 pg (ref 26.0–34.0)
MCHC: 34.2 g/dL (ref 30.0–36.0)
MCV: 93.8 fL (ref 78.0–100.0)
MONOS PCT: 9 % (ref 3–12)
MPV: 9.6 fL (ref 8.6–12.4)
Monocytes Absolute: 0.6 10*3/uL (ref 0.1–1.0)
NEUTROS ABS: 3.5 10*3/uL (ref 1.7–7.7)
Neutrophils Relative %: 55 % (ref 43–77)
Platelets: 201 10*3/uL (ref 150–400)
RBC: 4.99 MIL/uL (ref 4.22–5.81)
RDW: 13.3 % (ref 11.5–15.5)
WBC: 6.3 10*3/uL (ref 4.0–10.5)

## 2014-11-27 LAB — HEMOGLOBIN A1C
Hgb A1c MFr Bld: 5.4 % (ref <5.7)
Mean Plasma Glucose: 108 mg/dL (ref <117)

## 2014-11-27 LAB — LIPID PANEL
Cholesterol: 128 mg/dL (ref 0–200)
HDL: 39 mg/dL — ABNORMAL LOW (ref >=40)
LDL Cholesterol: 71 mg/dL (ref 0–99)
TRIGLYCERIDES: 89 mg/dL (ref <150)
Total CHOL/HDL Ratio: 3.3 Ratio
VLDL: 18 mg/dL (ref 0–40)

## 2014-11-27 LAB — MAGNESIUM: Magnesium: 2.1 mg/dL (ref 1.5–2.5)

## 2014-11-27 LAB — TSH: TSH: 0.877 u[IU]/mL (ref 0.350–4.500)

## 2014-11-27 NOTE — Progress Notes (Signed)
Patient ID: Kristopher Mendez, male   DOB: [birth year], 44 y.o.   MRN: [id number]  Assessment and Plan:  Hypertension:  -Continue medication,  -monitor blood pressure at home.  -Continue DASH diet.   -Reminder to go to the ER if any CP, SOB, nausea, dizziness, severe HA, changes vision/speech, left arm numbness and tingling, and jaw pain.  Cholesterol: Consider possible holiday for cholesterol -Continue diet and exercise.  -Check cholesterol.   Pre-diabetes: -Continue diet and exercise.  -Check A1C  Vitamin D Def: -check level -continue medications.   Continue diet and meds as discussed. Further disposition pending results of labs.  HPI 44 y.o. male  presents for 3 month follow up with hypertension, hyperlipidemia, prediabetes and vitamin D.   His blood pressure has been controlled at home, today their BP is BP: 122/60 mmHg.   He does workout. He denies chest pain, shortness of breath, dizziness.  He has been doing a lot of cross fit workout.     He is on cholesterol medication and denies myalgias. His cholesterol is at goal. The cholesterol last visit was:   Lab Results  Component Value Date   CHOL 125 08/20/2014   HDL 33* 08/20/2014   LDLCALC 71 08/20/2014   TRIG 103 08/20/2014   CHOLHDL 3.8 08/20/2014     He has been working on diet and exercise for prediabetes, and denies foot ulcerations, hyperglycemia, hypoglycemia , increased appetite, nausea, paresthesia of the feet, polydipsia, polyuria, visual disturbances, vomiting and weight loss. Last A1C in the office was:  Lab Results  Component Value Date   HGBA1C 5.6 07/08/2014    Patient is on Vitamin D supplement.  Lab Results  Component Value Date   VD25OH 96 07/08/2014     He reports that he is seeing an integrative medical doctor and he is helping him with diet and exercise.    He reports that he has been having a hard time falling asleep.  Current Medications:  Current Outpatient Prescriptions on File Prior to  Visit  Medication Sig Dispense Refill  . cetirizine (ZYRTEC) 10 MG tablet Take 10 mg by mouth daily.    . Cholecalciferol (VITAMIN D3) 5000 UNITS TABS Take 1 tablet by mouth.    . clotrimazole (LOTRIMIN) 1 % cream Apply 1 application topically 2 (two) times daily. X 2 weeks 60 g 0  . Magnesium 250 MG TABS Take by mouth 2 (two) times daily.    . mometasone (NASONEX) 50 MCG/ACT nasal spray Place 1-2 sprays into the nose daily. 17 g 0  . Multiple Vitamins-Minerals (MULTIVITAMIN PO) Take by mouth.    . nystatin (MYCOSTATIN/NYSTOP) 100000 UNIT/GM POWD APPLY TWICE A DAY 60 g 3  . Omega-3 Fatty Acids (FISH OIL) 1000 MG CAPS Take by mouth.    . Pregnenolone POWD 100 mg.    . Probiotic Product (PROBIOTIC DAILY PO) Take by mouth.    . rosuvastatin (CRESTOR) 10 MG tablet Take 1 tablet (10 mg total) by mouth at bedtime. 90 tablet 1  . thyroid (WP THYROID) 32.5 MG tablet Take 32.5 mg by mouth daily.     No current facility-administered medications on file prior to visit.    Medical History:  Past Medical History  Diagnosis Date  . Allergy   . Hyperlipidemia   . Vitamin D deficiency   . Dysplastic nevus     left shoulder  . Hemorrhoid 2015    Allergies:  Allergies  Allergen Reactions  . Eggs Or Egg-Derived Products Nausea  Only    Allergic to yoke     Review of Systems:  Review of Systems  Constitutional: Negative for fever, chills and malaise/fatigue.  HENT: Negative for congestion, ear pain and sore throat.   Eyes: Negative.   Respiratory: Negative for cough, shortness of breath and wheezing.   Cardiovascular: Negative for chest pain, palpitations and leg swelling.  Gastrointestinal: Negative for heartburn, diarrhea, constipation, blood in stool and melena.  Genitourinary: Negative.   Skin: Negative.   Neurological: Negative for dizziness, loss of consciousness and headaches.  Psychiatric/Behavioral: Negative for depression. The patient is not nervous/anxious and does not have  insomnia.     Family history- Review and unchanged  Social history- Review and unchanged  Physical Exam: BP 122/60 mmHg  Pulse 88  Temp(Src) 98.2 F (36.8 C) (Temporal)  Resp 18  Ht 6' 0.5" (1.842 m)  Wt 230 lb (104.327 kg)  BMI 30.75 kg/m2 Wt Readings from Last 3 Encounters:  11/27/14 230 lb (104.327 kg)  07/08/14 235 lb (106.595 kg)  05/03/14 235 lb (106.595 kg)    General Appearance: Well nourished well developed, in no apparent distress. Eyes: PERRLA, EOMs, conjunctiva no swelling or erythema ENT/Mouth: Ear canals normal without obstruction, swelling, erythma, discharge.  TMs normal bilaterally.  Oropharynx moist, clear, without exudate, or postoropharyngeal swelling. Neck: Supple, thyroid normal,no cervical adenopathy  Respiratory: Respiratory effort normal, Breath sounds clear A&P without rhonchi, wheeze, or rale.  No retractions, no accessory usage. Cardio: RRR with no MRGs. Brisk peripheral pulses without edema.  Abdomen: Soft, + BS,  Non tender, no guarding, rebound, hernias, masses. Musculoskeletal: Full ROM, 5/5 strength, Normal gait Skin: Warm, dry without rashes, lesions, ecchymosis.  Neuro: Awake and oriented X 3, Cranial nerves intact. Normal muscle tone, no cerebellar symptoms. Psych: Normal affect, Insight and Judgment appropriate.    Starlyn Skeans, PA-C 9:58 AM [facility]

## 2014-11-28 LAB — VITAMIN D 25 HYDROXY (VIT D DEFICIENCY, FRACTURES): VIT D 25 HYDROXY: 55 ng/mL (ref 30–100)

## 2014-11-28 LAB — INSULIN, RANDOM: INSULIN: 5.5 u[IU]/mL (ref 2.0–19.6)

## 2014-12-28 ENCOUNTER — Other Ambulatory Visit: Payer: Self-pay | Admitting: Internal Medicine

## 2014-12-28 ENCOUNTER — Encounter: Payer: Self-pay | Admitting: Emergency Medicine

## 2014-12-28 DIAGNOSIS — J0100 Acute maxillary sinusitis, unspecified: Secondary | ICD-10-CM

## 2014-12-28 MED ORDER — PREDNISONE 20 MG PO TABS: ORAL_TABLET | ORAL | Status: DC

## 2014-12-28 MED ORDER — AZITHROMYCIN 250 MG PO TABS: ORAL_TABLET | ORAL | Status: DC

## 2015-02-10 ENCOUNTER — Other Ambulatory Visit: Payer: Self-pay | Admitting: Physician Assistant

## 2015-04-15 ENCOUNTER — Ambulatory Visit (INDEPENDENT_AMBULATORY_CARE_PROVIDER_SITE_OTHER): Payer: BC Managed Care – PPO | Admitting: Internal Medicine

## 2015-04-15 ENCOUNTER — Encounter: Payer: Self-pay | Admitting: Internal Medicine

## 2015-04-15 VITALS — BP 116/64 | HR 74 | Temp 98.0°F | Resp 18 | Ht 72.5 in | Wt 230.0 lb

## 2015-04-15 DIAGNOSIS — J0100 Acute maxillary sinusitis, unspecified: Secondary | ICD-10-CM

## 2015-04-15 MED ORDER — AZITHROMYCIN 250 MG PO TABS: ORAL_TABLET | ORAL | Status: DC

## 2015-04-15 MED ORDER — PREDNISONE 20 MG PO TABS: ORAL_TABLET | ORAL | Status: DC

## 2015-04-15 MED ORDER — IPRATROPIUM BROMIDE 0.03 % NA SOLN: 2.0000 | Freq: Three times a day (TID) | NASAL | Status: DC

## 2015-04-15 NOTE — Progress Notes (Signed)
Patient ID: Kristopher Mendez, male   DOB: 12-Dec-1970, 43 y.o.   MRN: [id number]  HPI  Patient presents to the office for evaluation of cough.  It has been going on for 5 days.  Patient reports minimal coughing.  They also endorse change in voice, chills, postnasal drip and nasal congestion, sinus pressure, teeth pain, sore throat. .  They have tried antitussives, antihistamines or sudafed.  They report that nothing has worked.  They admits to other sick contacts. He is around sick children  Review of Systems  Constitutional: Positive for malaise/fatigue. Negative for fever and chills.  HENT: Positive for congestion. Negative for ear pain and sore throat.   Respiratory: Negative for cough, shortness of breath and wheezing.   Cardiovascular: Negative for chest pain, palpitations and leg swelling.  Neurological: Positive for headaches.    PE:  Filed Vitals:   04/15/15 1107  BP: 116/64  Pulse: 74  Temp: 98 F (36.7 C)  Resp: 18     General:  Alert and non-toxic, WDWN, NAD HEENT: NCAT, PERLA, EOM normal, no occular discharge or erythema.  Nasal mucosal edema with sinus tenderness to palpation.  Oropharynx clear with minimal oropharyngeal edema and erythema.  Mucous membranes moist and pink. Neck:  Cervical adenopathy Chest:  RRR no MRGs.  Lungs clear to auscultation A&P with no wheezes rhonchi or rales.   Abdomen: +BS x 4 quadrants, soft, non-tender, no guarding, rigidity, or rebound. Skin: warm and dry no rash Neuro: A&Ox4, CN II-XII grossly intact  Assessment and Plan:   1. Subacute maxillary sinusitis -nasal saline -zyrtec - predniSONE (DELTASONE) 20 MG tablet; 1 tab 3 x day for 2 days, then 1 tab 2 x day for 2 days, then 1 tab 1 x day for 3 days  Dispense: 13 tablet; Refill: 0 - ipratropium (ATROVENT) 0.03 % nasal spray; Place 2 sprays into the nose 3 (three) times daily.  Dispense: 30 mL; Refill: 2 - azithromycin (ZITHROMAX) 250 MG tablet; Take 2 tablets (500 mg) on  Day 1,   followed by 1 tablet (250 mg) once daily on Days 2 through 5.  Dispense: 6 each; Refill: 0

## 2015-05-07 ENCOUNTER — Telehealth: Payer: Self-pay | Admitting: Internal Medicine

## 2015-05-07 MED ORDER — PREDNISONE 20 MG PO TABS: ORAL_TABLET | ORAL | Status: DC

## 2015-05-07 MED ORDER — PROMETHAZINE-DM 6.25-15 MG/5ML PO SYRP: ORAL_SOLUTION | ORAL | Status: DC

## 2015-05-07 NOTE — Telephone Encounter (Signed)
Patient calling complaining of cough, congestion, sore throat, and drainage since Monday.  He is requesting an abx be sent in.

## 2015-05-15 ENCOUNTER — Encounter: Payer: Self-pay | Admitting: Internal Medicine

## 2015-05-15 ENCOUNTER — Other Ambulatory Visit: Payer: Self-pay | Admitting: Internal Medicine

## 2015-05-15 DIAGNOSIS — L22 Diaper dermatitis: Principal | ICD-10-CM

## 2015-05-15 DIAGNOSIS — B372 Candidiasis of skin and nail: Secondary | ICD-10-CM

## 2015-05-15 MED ORDER — NYSTATIN 100000 UNIT/GM EX POWD: CUTANEOUS | Status: DC

## 2015-05-25 ENCOUNTER — Ambulatory Visit: Payer: Self-pay | Admitting: Internal Medicine

## 2015-06-27 ENCOUNTER — Encounter: Payer: Self-pay | Admitting: Internal Medicine

## 2015-06-29 ENCOUNTER — Other Ambulatory Visit: Payer: Self-pay | Admitting: Internal Medicine

## 2015-06-29 DIAGNOSIS — B372 Candidiasis of skin and nail: Secondary | ICD-10-CM

## 2015-06-29 DIAGNOSIS — L22 Diaper dermatitis: Principal | ICD-10-CM

## 2015-06-29 MED ORDER — NYSTATIN 100000 UNIT/GM EX POWD: CUTANEOUS | Status: DC

## 2015-07-15 ENCOUNTER — Encounter: Payer: Self-pay | Admitting: Internal Medicine

## 2015-07-22 ENCOUNTER — Encounter: Payer: Self-pay | Admitting: Emergency Medicine

## 2015-08-22 ENCOUNTER — Encounter: Payer: Self-pay | Admitting: Internal Medicine

## 2015-08-24 ENCOUNTER — Other Ambulatory Visit: Payer: Self-pay | Admitting: Internal Medicine

## 2015-08-24 MED ORDER — CLOTRIMAZOLE 1 % EX CREA: 1.0000 "application " | TOPICAL_CREAM | Freq: Two times a day (BID) | CUTANEOUS | Status: DC

## 2015-09-15 ENCOUNTER — Encounter: Payer: Self-pay | Admitting: Internal Medicine

## 2015-09-15 ENCOUNTER — Ambulatory Visit (INDEPENDENT_AMBULATORY_CARE_PROVIDER_SITE_OTHER): Payer: BC Managed Care – PPO | Admitting: Internal Medicine

## 2015-09-15 VITALS — BP 120/90 | HR 91 | Temp 97.7°F | Resp 16 | Ht 72.5 in | Wt 224.2 lb

## 2015-09-15 DIAGNOSIS — J029 Acute pharyngitis, unspecified: Secondary | ICD-10-CM

## 2015-09-15 MED ORDER — PREDNISONE 20 MG PO TABS: ORAL_TABLET | ORAL | Status: DC

## 2015-09-15 MED ORDER — AZITHROMYCIN 250 MG PO TABS
ORAL_TABLET | ORAL | Status: DC
Start: 2015-09-15 — End: 2016-02-11

## 2015-09-15 NOTE — Progress Notes (Signed)
  Subjective:    Patient ID: Kristopher Mendez, male    DOB: 1971/04/16, 45 y.o.   MRN: [id number]  HPI  This very nice 45 yo single WM presented with 5+ day hx/o ST and painful swallowing. Denied fever, chills, N/V, sweats.   Medication Sig  . cetirizine ( 10 MG tablet Take 10 mg by mouth daily.  Marland Kitchen VITAMIN D3 5000 UNITS TABS Take 1 tablet by mouth.  Marland Kitchen LOTRIMIN 1 % cream Apply 1 application topically 2 (two) times daily. X 2 weeks  . ATROVENTnasal spray Place 2 sprays into the nose 3 (three) times daily.  . Magnesium 250 MG TABS Take by mouth 2 (two) times daily.  . Multiple Vitamins-Minerals  Take by mouth.  . Omega-3 FISH OIL 1000 MG CAPS Take by mouth.  . Pregnenolone POWD 100 mg.  . PROBIOTIC Take by mouth.  . rosuvastatin  10 MG tablet TAKE 1 TABLET BY MOUTH AT BEDTIME.  Marland Kitchen thyroid (WP THYROID) 32.5 MG tablet Take 32.5 mg by mouth daily.  Marland Kitchen NASONEX nasal spray Place 1-2 sprays into the nose daily.   Allergies  Allergen Reactions  . Eggs Or Egg-Derived Products Nausea Only    Allergic to yoke   Past Medical History  Diagnosis Date  . Allergy   . Hyperlipidemia   . Vitamin D deficiency   . Dysplastic nevus     left shoulder  . Hemorrhoid 2015   Review of Systems  10 point systems review negative except as above.    Objective:   Physical Exam  BP 120/90 mmHg  Pulse 91  Temp(Src) 97.7 F (36.5 C) (Temporal)  Resp 16  Ht 6' 0.5" (1.842 m)  Wt 224 lb 3.2 oz (101.696 kg)  BMI 29.97 kg/m2  SpO2 98%  HEENT - Eac's patent. TM's Nl. EOM's full. PERRLA. NasoOroPharynx 2-3(+) injected w/o exudates.  Neck - supple. Nl Thyroid. Carotids 2+ & No bruits, nodes, JVD Chest - Clear equal BS w/o Rales, rhonchi, wheezes. Cor - Nl HS. RRR w/o sig MGR. PP 1(+). No edema. MS- FROM w/o deformities. Muscle power, tone and bulk Nl. Gait Nl. Neuro - No obvious Cr N abnormalities. Sensory, motor and Cerebellar functions appear Nl w/o focal abnormalities. Skin - w/o exposed rashes.     Assessment & Plan:   1. Acute pharyngitis, unspecified etiology  - predniSONE (DELTASONE) 20 MG tablet; 1 tab 3 x day for 2 days, then 1 tab 2 x day for 2 days, then 1 tab 1 x day for 3 days  Dispense: 13 tablet; Refill: 0 - azithromycin (ZITHROMAX) 250 MG tablet; Take 2 tablets (500 mg) on  Day 1,  followed by 1 tablet (250 mg) once daily on Days 2 through 5.  Dispense: 6 each; Refill: 1  - discussed meds/SE's & ROV prn

## 2015-12-01 ENCOUNTER — Other Ambulatory Visit: Payer: Self-pay

## 2015-12-01 ENCOUNTER — Encounter (INDEPENDENT_AMBULATORY_CARE_PROVIDER_SITE_OTHER): Payer: Self-pay

## 2015-12-01 DIAGNOSIS — L22 Diaper dermatitis: Principal | ICD-10-CM

## 2015-12-01 DIAGNOSIS — B372 Candidiasis of skin and nail: Secondary | ICD-10-CM

## 2015-12-01 MED ORDER — NYSTATIN 100000 UNIT/GM EX POWD: CUTANEOUS | 3 refills | Status: DC

## 2015-12-26 ENCOUNTER — Encounter: Payer: Self-pay | Admitting: Internal Medicine

## 2015-12-26 ENCOUNTER — Other Ambulatory Visit: Payer: Self-pay | Admitting: Internal Medicine

## 2015-12-26 MED ORDER — CLOTRIMAZOLE 1 % EX CREA: 1.0000 "application " | TOPICAL_CREAM | Freq: Two times a day (BID) | CUTANEOUS | 3 refills | Status: DC

## 2016-02-11 ENCOUNTER — Ambulatory Visit (INDEPENDENT_AMBULATORY_CARE_PROVIDER_SITE_OTHER): Payer: BC Managed Care – PPO | Admitting: Internal Medicine

## 2016-02-11 ENCOUNTER — Encounter: Payer: Self-pay | Admitting: Internal Medicine

## 2016-02-11 DIAGNOSIS — J029 Acute pharyngitis, unspecified: Secondary | ICD-10-CM | POA: Diagnosis not present

## 2016-02-11 MED ORDER — AZITHROMYCIN 250 MG PO TABS: ORAL_TABLET | ORAL | 1 refills | Status: DC

## 2016-02-11 MED ORDER — MOMETASONE FUROATE 50 MCG/ACT NA SUSP: 1.0000 | Freq: Every day | NASAL | 0 refills | Status: DC

## 2016-02-11 MED ORDER — LIDOCAINE VISCOUS 2 % MT SOLN: 20.0000 mL | OROMUCOSAL | 0 refills | Status: DC | PRN

## 2016-02-11 MED ORDER — PREDNISONE 20 MG PO TABS: ORAL_TABLET | ORAL | 0 refills | Status: DC

## 2016-02-11 NOTE — Progress Notes (Signed)
HPI  Patient presents to the office for evaluation of sore throat.  It has been going on for 1 weeks.  Patient reports no cough.  They also endorse change in voice, chills and nasal congestion, sinus pressure, teeth hurting, ear itching, sore throat, and sore glands.   They have tried dayquil.  They report that nothing has worked.  They admits to other sick contacts.  Review of Systems  Constitutional: Positive for malaise/fatigue. Negative for chills and fever.  HENT: Positive for congestion, ear pain, hearing loss and sore throat.   Respiratory: Positive for cough. Negative for sputum production, shortness of breath and wheezing.   Cardiovascular: Negative for chest pain, palpitations and leg swelling.  Neurological: Positive for headaches.    PE:  Vitals:   02/11/16 0906  BP: 118/78  Pulse: 82  Resp: 18  Temp: 98.2 F (36.8 C)    General:  Alert and non-toxic, WDWN, NAD HEENT: NCAT, PERLA, EOM normal, no occular discharge or erythema.  Nasal mucosal edema with sinus tenderness to palpation.  Oropharynx clear with minimal oropharyngeal edema and erythema.  Mucous membranes moist and pink. Neck:  Cervical adenopathy Chest:  RRR no MRGs.  Lungs clear to auscultation A&P with no wheezes rhonchi or rales.   Abdomen: +BS x 4 quadrants, soft, non-tender, no guarding, rigidity, or rebound. Skin: warm and dry no rash Neuro: A&Ox4, CN II-XII grossly intact  Assessment and Plan:   1. Acute pharyngitis, unspecified etiology -push fluids -sudafed TID x 3-4 days - azithromycin (ZITHROMAX) 250 MG tablet; Take 2 tablets (500 mg) on  Day 1,  followed by 1 tablet (250 mg) once daily on Days 2 through 5.  Dispense: 6 each; Refill: 1 - mometasone (NASONEX) 50 MCG/ACT nasal spray; Place 1-2 sprays into the nose daily.  Dispense: 17 g; Refill: 0 - predniSONE (DELTASONE) 20 MG tablet; 1 tab 3 x day for 2 days, then 1 tab 2 x day for 2 days, then 1 tab 1 x day for 3 days  Dispense: 13 tablet;  Refill: 0 - lidocaine (XYLOCAINE) 2 % solution; Use as directed 20 mLs in the mouth or throat as needed for mouth pain.  Dispense: 100 mL; Refill: 0

## 2016-05-25 ENCOUNTER — Encounter: Payer: Self-pay | Admitting: Internal Medicine

## 2016-05-26 ENCOUNTER — Other Ambulatory Visit: Payer: Self-pay | Admitting: Internal Medicine

## 2016-05-26 DIAGNOSIS — J029 Acute pharyngitis, unspecified: Secondary | ICD-10-CM

## 2016-05-26 MED ORDER — PREDNISONE 20 MG PO TABS: ORAL_TABLET | ORAL | 0 refills | Status: DC

## 2016-08-11 ENCOUNTER — Emergency Department (HOSPITAL_COMMUNITY)
Admission: EM | Admit: 2016-08-11 | Discharge: 2016-08-11 | Disposition: A | Discharge status: Home / Self Care | Payer: BC Managed Care – PPO | Attending: Emergency Medicine | Admitting: Emergency Medicine

## 2016-08-11 ENCOUNTER — Encounter (HOSPITAL_COMMUNITY): Payer: Self-pay

## 2016-08-11 ENCOUNTER — Emergency Department (HOSPITAL_COMMUNITY): Payer: BC Managed Care – PPO

## 2016-08-11 DIAGNOSIS — M545 Low back pain, unspecified: Secondary | ICD-10-CM

## 2016-08-11 DIAGNOSIS — Y999 Unspecified external cause status: Secondary | ICD-10-CM | POA: Diagnosis not present

## 2016-08-11 DIAGNOSIS — W1789XA Other fall from one level to another, initial encounter: Secondary | ICD-10-CM | POA: Diagnosis not present

## 2016-08-11 DIAGNOSIS — Y939 Activity, unspecified: Secondary | ICD-10-CM | POA: Diagnosis not present

## 2016-08-11 DIAGNOSIS — Y929 Unspecified place or not applicable: Secondary | ICD-10-CM | POA: Insufficient documentation

## 2016-08-11 LAB — COMPREHENSIVE METABOLIC PANEL
ALBUMIN: 3.9 g/dL (ref 3.5–5.0)
ALT: 31 U/L (ref 17–63)
AST: 27 U/L (ref 15–41)
Alkaline Phosphatase: 99 U/L (ref 38–126)
Anion gap: 7 (ref 5–15)
BUN: 14 mg/dL (ref 6–20)
CHLORIDE: 109 mmol/L (ref 101–111)
CO2: 25 mmol/L (ref 22–32)
CREATININE: 1.13 mg/dL (ref 0.61–1.24)
Calcium: 9 mg/dL (ref 8.9–10.3)
Glucose, Bld: 93 mg/dL (ref 65–99)
POTASSIUM: 3.9 mmol/L (ref 3.5–5.1)
Sodium: 141 mmol/L (ref 135–145)
Total Bilirubin: 0.6 mg/dL (ref 0.3–1.2)
Total Protein: 7 g/dL (ref 6.5–8.1)

## 2016-08-11 LAB — CBC WITH DIFFERENTIAL/PLATELET
Basophils Absolute: 0 10*3/uL (ref 0.0–0.1)
Basophils Relative: 0 %
Eosinophils Absolute: 0.2 10*3/uL (ref 0.0–0.7)
Eosinophils Relative: 2 %
HEMATOCRIT: 44.4 % (ref 39.0–52.0)
HEMOGLOBIN: 15.6 g/dL (ref 13.0–17.0)
LYMPHS ABS: 4.8 10*3/uL — AB (ref 0.7–4.0)
LYMPHS PCT: 51 %
MCH: 31.4 pg (ref 26.0–34.0)
MCHC: 35.1 g/dL (ref 30.0–36.0)
MCV: 89.3 fL (ref 78.0–100.0)
MONOS PCT: 9 %
Monocytes Absolute: 0.9 10*3/uL (ref 0.1–1.0)
NEUTROS ABS: 3.6 10*3/uL (ref 1.7–7.7)
NEUTROS PCT: 38 %
Platelets: 194 10*3/uL (ref 150–400)
RBC: 4.97 MIL/uL (ref 4.22–5.81)
RDW: 12.6 % (ref 11.5–15.5)
WBC: 9.5 10*3/uL (ref 4.0–10.5)

## 2016-08-11 MED ORDER — DIAZEPAM 5 MG PO TABS
5.0000 mg | ORAL_TABLET | Freq: Once | ORAL | Status: AC
  Administered 2016-08-11: 5 mg via ORAL
  Filled 2016-08-11: qty 1

## 2016-08-11 MED ORDER — OXYCODONE-ACETAMINOPHEN 5-325 MG PO TABS: 1.0000 | ORAL_TABLET | Freq: Four times a day (QID) | ORAL | 0 refills | Status: DC | PRN

## 2016-08-11 MED ORDER — IBUPROFEN 800 MG PO TABS: 800.0000 mg | ORAL_TABLET | Freq: Three times a day (TID) | ORAL | 0 refills | Status: DC | PRN

## 2016-08-11 NOTE — ED Provider Notes (Signed)
Emergency Department Provider Note   I have reviewed the triage vital signs and the nursing notes.   HISTORY  Chief Complaint Back Pain   HPI Kristopher Mendez is a 46 y.o. male with PMH of HLD presents to the emergency department for evaluation of lower back pain. The patient was resting in a hammock when it broke and he fell to the ground landing on his back. Nystatin trauma or loss of consciousness. He's had severe pain in his back throughout the afternoon along with some bruising there. He is ambulatory with some discomfort. He denies any numbness or tingling in the legs. No bladder or bowel incontinence. No similar back pain in the past. Patient has not taken any medication.    Past Medical History:  Diagnosis Date  . Allergy   . Dysplastic nevus    left shoulder  . Hemorrhoid 2015  . Hyperlipidemia   . Vitamin D deficiency     Patient Active Problem List   Diagnosis Date Noted  . Medication management 08/20/2014  . External hemorrhoid, thrombosed left side 11/20/2013  . Mixed hyperlipidemia 07/05/2013  . Ringworm of body 07/05/2013  . Nail fungus 07/05/2013  . Allergy   . Vitamin D deficiency     Past Surgical History:  Procedure Laterality Date  . HEMORROIDECTOMY  2015  . SHOULDER SURGERY Right    spurs  . SKIN CANCER EXCISION Left 12/2012   SHOULDER    Current Outpatient Rx  . Order #: [id number] Class: Historical Med  . Order #: [id number] Class: Historical Med  . Order #: [id number] Class: Historical Med  . Order #: [id number] Class: Historical Med  . Order #: [id number] Class: Historical Med  . Order #: [id number] Class: Historical Med  . Order #: [id number] Class: Historical Med  . Order #: [id number] Class: Print  . Order #: [id number] Class: Normal  . Order #: [id number] Class: Print  . Order #: [id number] Class: Normal    Allergies Eggs or egg-derived products  Family History  Problem Relation Age of Onset  . Diabetes Father   . Hyperlipidemia Father   . Heart  disease Father 54  . Hypertension Father   . Hypertension Other   . Heart disease Other   . Hyperlipidemia Other   . Hyperlipidemia Mother   . Heart disease Mother 31  . Hypertension Mother   . Heart disease Brother   . Hyperlipidemia Brother   . Hypertension Brother   . Cancer Maternal Grandmother     lymphoma    Social History Social History  Substance Use Topics  . Smoking status: Never Smoker  . Smokeless tobacco: Never Used  . Alcohol use No    Review of Systems  Constitutional: No fever/chills Eyes: No visual changes. ENT: No sore throat. Cardiovascular: Denies chest pain. Respiratory: Denies shortness of breath. Gastrointestinal: No abdominal pain.  No nausea, no vomiting.  No diarrhea.  No constipation. Genitourinary: Negative for dysuria. Musculoskeletal: Positive for back pain. Skin: Negative for rash. Neurological: Negative for headaches, focal weakness or numbness.  10-point ROS otherwise negative.  ____________________________________________   PHYSICAL EXAM:  VITAL SIGNS: ED Triage Vitals  Enc Vitals Group     BP 08/11/16 1738 (!) 130/99     Pulse Rate 08/11/16 1738 90     Resp 08/11/16 1738 18     Temp 08/11/16 1738 98.3 F (36.8 C)     Temp Source 08/11/16 1738 Oral     SpO2 08/11/16 1738 98 %     Pain Score 08/11/16 1742 10  Constitutional: Alert and oriented. Well appearing and in no acute distress. Eyes: Conjunctivae are normal.  Head: Atraumatic. Nose: No congestion/rhinnorhea. Mouth/Throat: Mucous membranes are moist.   Neck: No stridor. No cervical spine tenderness to palpation. Cardiovascular: Normal rate, regular rhythm. Good peripheral circulation. Grossly normal heart sounds.   Respiratory: Normal respiratory effort.  No retractions. Lungs CTAB. Gastrointestinal: Soft and nontender. No distention.  Musculoskeletal: No lower extremity tenderness nor edema. No gross deformities of extremities. Tenderness to palpation over the  midline lumbar spine with mild abrasion. Some associated perispinal tenderness. No step-offs or deformity.  Neurologic:  Normal speech and language. No gross focal neurologic deficits are appreciated.  Skin:  Skin is warm, dry and intact. No rash noted. Psychiatric: Mood and affect are normal. Speech and behavior are normal.  ____________________________________________   LABS (all labs ordered are listed, but only abnormal results are displayed)  Labs Reviewed  CBC WITH DIFFERENTIAL/PLATELET - Abnormal; Notable for the following:       Result Value   Lymphs Abs 4.8 (*)    All other components within normal limits  COMPREHENSIVE METABOLIC PANEL   ____________________________________________  RADIOLOGY  Dg Lumbar Spine Complete  Result Date: 08/11/2016 CLINICAL DATA:  [place] off hammock onto back today. Low back pain. Initial encounter. EXAM: LUMBAR SPINE - COMPLETE 4+ VIEW COMPARISON:  None. FINDINGS: There is no evidence of lumbar spine fracture. Alignment is normal. Advanced degenerative disc disease and facet DJD is seen at L5-S1. Other intervertebral disc spaces are maintained. 1.8 cm rounded calcific density in left upper quadrant most likely represents a calcified splenic artery aneurysm. IMPRESSION: No acute findings.  L5-S1 degenerative disc disease and facet DJD. 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 Electronically Signed   By: Earle Gell M.D.   On: 08/11/2016 19:10    ____________________________________________   PROCEDURES  Procedure(s) performed:   Procedures  None ____________________________________________   INITIAL IMPRESSION / ASSESSMENT AND PLAN / ED COURSE  Pertinent labs & imaging results that were available during my care of the patient were reviewed by me and considered in my medical decision making (see chart for details).  Patient with lower back pain after fall onto his back from a hammock. No neurological  deficits. Patient is ambulatory at bedside on my initial evaluation. He has some mild tenderness to palpation over the lumbar spine with faint abrasion noted. Plan for Valium and plain film of the lumbar spine.   Patient feeling slightly better after Valium on re-examination. No fracture or other acute findings on plain film. Discussed the incidental calcified aneurysm finding. No pain in this area. Will follow with PCP for this and low back pain. Encouraged early mobility, NSAIDs, heat, and percocet as needed only for severe pain. Gave < 5 day supply.   At this time, I do not feel there is any life-threatening condition present. I have reviewed and discussed all results (EKG, imaging, lab, urine as appropriate), exam findings with patient. I have reviewed nursing notes and appropriate previous records.  I feel the patient is safe to be discharged home without further emergent workup. Discussed usual and customary return precautions. Patient and family (if present) verbalize understanding and are comfortable with this plan.  Patient will follow-up with their primary care provider. If they do not have a primary care provider, information for follow-up has been provided to them. All questions have been answered.  ____________________________________________  FINAL CLINICAL IMPRESSION(S) / ED DIAGNOSES  Final diagnoses:  Acute  midline low back pain without sciatica     MEDICATIONS GIVEN DURING THIS VISIT:  Medications  diazepam (VALIUM) tablet 5 mg (5 mg Oral Given 08/11/16 1810)     NEW OUTPATIENT MEDICATIONS STARTED DURING THIS VISIT:  Discharge Medication List as of 08/11/2016  8:16 PM    START taking these medications   Details  oxyCODONE-acetaminophen (PERCOCET/ROXICET) 5-325 MG tablet Take 1 tablet by mouth every 6 (six) hours as needed for severe pain., Starting Thu 08/11/2016, Print          Note:  This document was prepared using Dragon voice recognition software and may include  unintentional dictation errors.  Nanda Quinton, MD Emergency Medicine   Margette Fast, MD 08/12/16 [phone]

## 2016-08-11 NOTE — ED Triage Notes (Signed)
Pt was swinging in a hammock and the hammock gave way, causing him to land on his lumbar area. Pt states that pain started immediately. He has been able to ambulate. Denies neck pain. Denies LOC or hitting head. A&Ox4.

## 2016-08-11 NOTE — Discharge Instructions (Signed)
You have been seen in the Emergency Department (ED)  today for back pain.  Your workup and exam have not shown any acute abnormalities and you are likely suffering from muscle strain or possible problems with your discs, but there is no treatment that will fix your symptoms at this time.  Please take Motrin (ibuprofen) as needed for your pain according to the instructions written on the box.  Alternatively, for the next five days you can take 600mg  three times daily with meals (it may upset your stomach).  Take Percocet as prescribed for severe pain. Do not drink alcohol, drive or participate in any other potentially dangerous activities while taking this medication as it may make you sleepy. Do not take this medication with any other sedating medications, either prescription or over-the-counter. If you were prescribed Percocet or Vicodin, do not take these with acetaminophen (Tylenol) as it is already contained within these medications.   This medication is an opiate (or narcotic) pain medication and can be habit forming.  Use it as little as possible to achieve adequate pain control.  Do not use or use it with extreme caution if you have a history of opiate abuse or dependence.  If you are on a pain contract with your primary care doctor or a pain specialist, be sure to let them know you were prescribed this medication today from the Emergency Department.  This medication is intended for your use only - do not give any to anyone else and keep it in a secure place where nobody else, especially children, have access to it.  It will also cause or worsen constipation, so you may want to consider taking an over-the-counter stool softener while you are taking this medication.  Please follow up with your doctor as soon as possible regarding today's ED visit and your back pain.  Return to the ED for worsening back pain, fever, weakness or numbness of either leg, or if you develop either (1) an inability to urinate  or have bowel movements, or (2) loss of your ability to control your bathroom functions (if you start having "accidents"), or if you develop other new symptoms that concern you.

## 2016-08-11 NOTE — ED Notes (Signed)
Patient transported to X-ray 

## 2019-07-06 ENCOUNTER — Ambulatory Visit: Payer: BC Managed Care – PPO | Attending: Internal Medicine

## 2019-07-06 DIAGNOSIS — Z23 Encounter for immunization: Secondary | ICD-10-CM | POA: Insufficient documentation

## 2019-07-06 NOTE — Progress Notes (Signed)
   Covid-19 Vaccination Clinic  Name:  Kristopher Mendez    MRN: [id number] DOB: [birth year]  07/06/2019  Kristopher Mendez was observed post Covid-19 immunization for 15 minutes without incidence. He was provided with Vaccine Information Sheet and instruction to access the V-Safe system.   Kristopher Mendez was instructed to call 911 with any severe reactions post vaccine: Marland Kitchen Difficulty breathing  . Swelling of your face and throat  . A fast heartbeat  . A bad rash all over your body  . Dizziness and weakness    Immunizations Administered    Name Date Dose VIS Date Route   Pfizer COVID-19 Vaccine 07/06/2019  5:21 PM 0.3 mL 04/19/2019 Intramuscular   Manufacturer: [place]   Lot: [id number]   [place]: [id number]

## 2019-07-27 ENCOUNTER — Ambulatory Visit: Payer: BC Managed Care – PPO | Attending: Internal Medicine

## 2019-07-27 DIAGNOSIS — Z23 Encounter for immunization: Secondary | ICD-10-CM

## 2019-07-27 NOTE — Progress Notes (Signed)
   Covid-19 Vaccination Clinic  Name:  Kristopher Mendez    MRN: [id number] DOB: 12/13/1970  07/27/2019  [name] was observed post Covid-19 immunization for 15 minutes without incident. He was provided with Vaccine Information Sheet and instruction to access the V-Safe system.   [name] was instructed to call 911 with any severe reactions post vaccine: Marland Kitchen Difficulty breathing  . Swelling of face and throat  . A fast heartbeat  . A bad rash all over body  . Dizziness and weakness   Immunizations Administered    Name Date Dose VIS Date Route   Pfizer COVID-19 Vaccine 07/27/2019  9:26 AM 0.3 mL 04/19/2019 Intramuscular   Manufacturer: [place]   Lot: [id number]   [place]: [id number]

## 2020-11-22 ENCOUNTER — Emergency Department (HOSPITAL_BASED_OUTPATIENT_CLINIC_OR_DEPARTMENT_OTHER): Payer: BC Managed Care – PPO

## 2020-11-22 ENCOUNTER — Other Ambulatory Visit: Payer: Self-pay

## 2020-11-22 ENCOUNTER — Encounter (HOSPITAL_BASED_OUTPATIENT_CLINIC_OR_DEPARTMENT_OTHER): Payer: Self-pay

## 2020-11-22 ENCOUNTER — Emergency Department (HOSPITAL_BASED_OUTPATIENT_CLINIC_OR_DEPARTMENT_OTHER)
Admission: EM | Admit: 2020-11-22 | Discharge: 2020-11-22 | Disposition: A | Discharge status: Home / Self Care | Payer: BC Managed Care – PPO | Attending: Emergency Medicine | Admitting: Emergency Medicine

## 2020-11-22 DIAGNOSIS — L03315 Cellulitis of perineum: Secondary | ICD-10-CM | POA: Diagnosis not present

## 2020-11-22 DIAGNOSIS — N5082 Scrotal pain: Secondary | ICD-10-CM | POA: Diagnosis not present

## 2020-11-22 LAB — URINALYSIS, ROUTINE W REFLEX MICROSCOPIC
Bilirubin Urine: NEGATIVE
Glucose, UA: NEGATIVE mg/dL
Ketones, ur: NEGATIVE mg/dL
Leukocytes,Ua: NEGATIVE
Nitrite: NEGATIVE
Protein, ur: NEGATIVE mg/dL
Specific Gravity, Urine: 1.024 (ref 1.005–1.030)
pH: 5.5 (ref 5.0–8.0)

## 2020-11-22 MED ORDER — DOXYCYCLINE HYCLATE 100 MG PO CAPS: 100.0000 mg | ORAL_CAPSULE | Freq: Two times a day (BID) | ORAL | 0 refills | Status: DC

## 2020-11-22 MED ORDER — IBUPROFEN 400 MG PO TABS
600.0000 mg | ORAL_TABLET | Freq: Once | ORAL | Status: AC
  Administered 2020-11-22: 600 mg via ORAL
  Filled 2020-11-22: qty 1

## 2020-11-22 MED ORDER — DOXYCYCLINE HYCLATE 100 MG PO TABS
100.0000 mg | ORAL_TABLET | Freq: Once | ORAL | Status: AC
  Administered 2020-11-22: 100 mg via ORAL
  Filled 2020-11-22: qty 1

## 2020-11-22 NOTE — ED Provider Notes (Signed)
[facility] EMERGENCY DEPT Provider Note   CSN: [id number] Arrival date & time: 11/22/20  2118     History Chief Complaint  Patient presents with   Groin Pain   Groin Swelling    Kristopher Mendez is a 50 y.o. male.  HPI 50 year old male presents with right testicular pain.  It has been swollen.  Started 2 days ago and has been steady ever since.  The swelling might be a little bit better but it still there.  Certain positions make it worse.  No fevers, dysuria, discharge.  Has not taken anything for the pain. He denies concern for STI. Patient's husband is a Marine scientist and advised him to get seen to rule out torsion.   Past Medical History:  Diagnosis Date   Allergy    Dysplastic nevus    left shoulder   Hemorrhoid 2015   Hyperlipidemia    Vitamin D deficiency     Patient Active Problem List   Diagnosis Date Noted   Medication management 08/20/2014   External hemorrhoid, thrombosed left side 11/20/2013   Mixed hyperlipidemia 07/05/2013   Ringworm of body 07/05/2013   Nail fungus 07/05/2013   Allergy    Vitamin D deficiency     Past Surgical History:  Procedure Laterality Date   HEMORROIDECTOMY  2015   SHOULDER SURGERY Right    spurs   SKIN CANCER EXCISION Left 12/2012   SHOULDER       Family History  Problem Relation Age of Onset   Diabetes Father    Hyperlipidemia Father    Heart disease Father 65   Hypertension Father    Hypertension Other    Heart disease Other    Hyperlipidemia Other    Hyperlipidemia Mother    Heart disease Mother 109   Hypertension Mother    Heart disease Brother    Hyperlipidemia Brother    Hypertension Brother    Cancer Maternal Grandmother        lymphoma    Social History   Tobacco Use   Smoking status: Never   Smokeless tobacco: Never  Substance Use Topics   Alcohol use: No   Drug use: No    Home Medications Prior to Admission medications   Medication Sig Start Date End Date Taking? Authorizing  Provider  doxycycline (VIBRAMYCIN) 100 MG capsule Take 1 capsule (100 mg total) by mouth 2 (two) times daily. One po bid x 7 days 11/22/20  Yes Sherwood Gambler, MD  cetirizine (ZYRTEC) 10 MG tablet Take 10 mg by mouth daily.    [provider]  Cholecalciferol (VITAMIN D3) 5000 UNITS TABS Take 1 tablet by mouth.    [provider]  ibuprofen (ADVIL,MOTRIN) 800 MG tablet Take 1 tablet (800 mg total) by mouth every 8 (eight) hours as needed. 08/11/16   Long, Wonda Olds, MD  mometasone (NASONEX) 50 MCG/ACT nasal spray Place 1-2 sprays into the nose daily. Patient not taking: Reported on 08/11/2016 02/11/16 02/10/17  Rolene Course, PA-C  Multiple Vitamins-Minerals (MULTIVITAMIN PO) Take by mouth.    [provider]  Omega-3 Fatty Acids (FISH OIL) 1000 MG CAPS Take by mouth.    [provider]  oxyCODONE-acetaminophen (PERCOCET/ROXICET) 5-325 MG tablet Take 1 tablet by mouth every 6 (six) hours as needed for severe pain. 08/11/16   Long, Wonda Olds, MD  predniSONE (DELTASONE) 20 MG tablet 1 tab 3 x day for 2 days, then 1 tab 2 x day for 2 days, then 1 tab 1 x  day for 3 days Patient not taking: Reported on 08/11/2016 05/26/16   Rolene Course, PA-C  Pregnenolone POWD 100 mg.    [provider]  Probiotic Product (PROBIOTIC DAILY PO) Take by mouth.    [provider]  thyroid (ARMOUR) 90 MG tablet Take 90 mg by mouth daily.    [provider]    Allergies    Patient has no active allergies.  Review of Systems   Review of Systems  Constitutional:  Negative for fever.  Genitourinary:  Positive for scrotal swelling and testicular pain. Negative for dysuria and penile discharge.  All other systems reviewed and are negative.  Physical Exam Updated Vital Signs BP (!) 155/78 (BP Location: Right Arm)   Pulse (!) 107   Temp 98.4 F (36.9 C)   Resp 16   Ht 6\' 1"  (1.854 m)   Wt 113.4 kg   SpO2 98%   BMI 32.98 kg/m   Physical Exam Vitals and  nursing note reviewed.  Constitutional:      Appearance: He is well-developed.  HENT:     Head: Normocephalic and atraumatic.     Right Ear: External ear normal.     Left Ear: External ear normal.     Nose: Nose normal.  Eyes:     General:        Right eye: No discharge.        Left eye: No discharge.  Pulmonary:     Effort: Pulmonary effort is normal.  Abdominal:     General: There is no distension.     Palpations: Abdomen is soft.     Tenderness: There is no abdominal tenderness.     Comments: No obvious inguinal hernia  Genitourinary:    Penis: Circumcised. No tenderness.      Testes:        Right: Tenderness present.        Left: Tenderness not present.  Musculoskeletal:     Cervical back: Neck supple.  Skin:    General: Skin is warm and dry.  Neurological:     Mental Status: He is alert.  Psychiatric:        Mood and Affect: Mood is not anxious.    ED Results / Procedures / Treatments   Labs (all labs ordered are listed, but only abnormal results are displayed) Labs Reviewed  URINALYSIS, ROUTINE W REFLEX MICROSCOPIC  GC/CHLAMYDIA PROBE AMP ([place]) NOT AT [facility]    EKG None  Radiology US SCROTUM W/DOPPLER  Result Date: 11/22/2020 CLINICAL DATA:  Right testicle pain EXAM: SCROTAL ULTRASOUND DOPPLER ULTRASOUND OF THE TESTICLES TECHNIQUE: Complete ultrasound examination of the testicles, epididymis, and other scrotal structures was performed. Color and spectral Doppler ultrasound were also utilized to evaluate blood flow to the testicles. COMPARISON:  None. FINDINGS: Right testicle Measurements: 4.6 x 2.9 x 3.1 cm. No mass or microlithiasis visualized. Thickening of the right scrotal wall Left testicle Measurements: 4.3 x 2.7 x 2.8 cm. No mass or microlithiasis visualized. Right epididymis:  Normal in size and appearance. Left epididymis:  Normal in size and appearance. Hydrocele:  Small bilateral hydroceles. Varicocele:  Small right varicocele. Pulsed Doppler  interrogation of both testes demonstrates normal low resistance arterial and venous waveforms bilaterally. IMPRESSION: 1. Right scrotal wall thickening without evidence for torsion or intra testicular mass. 2. Small bilateral hydroceles 3. Small right varicocele Electronically Signed   By: Donavan Foil M.D.   On: 11/22/2020 22:38    Procedures Procedures   Medications  Ordered in ED Medications  doxycycline (VIBRA-TABS) tablet 100 mg (has no administration in time range)  ibuprofen (ADVIL) tablet 600 mg (600 mg Oral Given 11/22/20 2206)    ED Course  I have reviewed the triage vital signs and the nursing notes.  Pertinent labs & imaging results that were available during my care of the patient were reviewed by me and considered in my medical decision making (see chart for details).    MDM Rules/Calculators/A&P                          No obvious orchitis/epididymitis on ultrasound.  No torsion. There is some thickening to the right testicle wall on the ultrasound and so I will cover for cellulitis.  Otherwise, NSAIDs and Tylenol for pain.  Given that his testicle is tender, my suspicion of abdominal emergency or ureteral stone is pretty low.  Follow-up with urology in the next few days if not improving.  Given return precautions. Final Clinical Impression(s) / ED Diagnoses Final diagnoses:  Scrotal pain  Cellulitis of perineum    Rx / DC Orders ED Discharge Orders          Ordered    doxycycline (VIBRAMYCIN) 100 MG capsule  2 times daily        11/22/20 2255             Sherwood Gambler, MD 11/22/20 2257

## 2020-11-22 NOTE — Discharge Instructions (Addendum)
If you develop fever, new or worsening pain, vomiting, abdominal pain, scrotal swelling, or any other new/concerning symptoms then return to the ER for evaluation

## 2020-11-22 NOTE — ED Triage Notes (Addendum)
Pt is present for right groin pain that started two days ago and it has now radiated to his right testicle. His right testicle is swollen and painful. Pt does not recall any injury or trauma to the area. Pt is having a hard time sitting down and would rather stand up due to the pain.

## 2020-11-24 LAB — GC/CHLAMYDIA PROBE AMP (~~LOC~~) NOT AT ARMC
Chlamydia: NEGATIVE
Comment: NEGATIVE
Comment: NORMAL
Neisseria Gonorrhea: NEGATIVE

## 2021-03-03 ENCOUNTER — Encounter: Payer: Self-pay | Admitting: Gastroenterology

## 2021-04-19 ENCOUNTER — Ambulatory Visit (AMBULATORY_SURGERY_CENTER): Payer: Self-pay | Admitting: *Deleted

## 2021-04-19 ENCOUNTER — Encounter: Payer: Self-pay | Admitting: Gastroenterology

## 2021-04-19 ENCOUNTER — Other Ambulatory Visit: Payer: Self-pay

## 2021-04-19 VITALS — Ht 73.0 in | Wt 257.0 lb

## 2021-04-19 DIAGNOSIS — Z1211 Encounter for screening for malignant neoplasm of colon: Secondary | ICD-10-CM

## 2021-04-19 MED ORDER — NA SULFATE-K SULFATE-MG SULF 17.5-3.13-1.6 GM/177ML PO SOLN: 1.0000 | ORAL | 0 refills | Status: DC

## 2021-04-19 NOTE — Progress Notes (Signed)
Patient is here in-person for PV. Patient denies any allergies to eggs or soy. Patient denies any problems with anesthesia/sedation. Patient is not on any oxygen at home. Patient is not taking any diet/weight loss medications or blood thinners. Patient is aware of our care-partner policy and Covid-19 safety protocol.   EMMI education assigned to the patient for the procedure, sent to MyChart.   Patient is COVID-19 vaccinated.  

## 2021-05-04 ENCOUNTER — Other Ambulatory Visit: Payer: Self-pay

## 2021-05-04 ENCOUNTER — Ambulatory Visit (AMBULATORY_SURGERY_CENTER): Payer: BC Managed Care – PPO | Admitting: Gastroenterology

## 2021-05-04 ENCOUNTER — Encounter: Payer: Self-pay | Admitting: Gastroenterology

## 2021-05-04 VITALS — BP 107/71 | HR 82 | Temp 97.5°F | Resp 20 | Ht 73.0 in | Wt 257.0 lb

## 2021-05-04 DIAGNOSIS — D122 Benign neoplasm of ascending colon: Secondary | ICD-10-CM | POA: Diagnosis not present

## 2021-05-04 DIAGNOSIS — Z1211 Encounter for screening for malignant neoplasm of colon: Secondary | ICD-10-CM | POA: Diagnosis present

## 2021-05-04 DIAGNOSIS — D124 Benign neoplasm of descending colon: Secondary | ICD-10-CM | POA: Diagnosis not present

## 2021-05-04 MED ORDER — SODIUM CHLORIDE 0.9 % IV SOLN: 500.0000 mL | Freq: Once | INTRAVENOUS | Status: DC

## 2021-05-04 NOTE — Progress Notes (Signed)
Called to room to assist during endoscopic procedure.  Patient ID and intended procedure confirmed with present staff. Received instructions for my participation in the procedure from the performing physician.  

## 2021-05-04 NOTE — Op Note (Signed)
[facility] Patient Name: Kristopher Mendez Procedure Date: 05/04/2021 7:58 AM MRN: [id number] Endoscopist: Nicki Reaper E. Candis Schatz , MD Age: 50 Referring MD:  Date of Birth: [birth year] Gender: Male Account #: [id number] Procedure:                Colonoscopy Indications:              Screening for colorectal malignant neoplasm, This                            is the patient's first colonoscopy Medicines:                Monitored Anesthesia Care Procedure:                Pre-Anesthesia Assessment:                           - Prior to the procedure, a History and Physical                            was performed, and patient medications and                            allergies were reviewed. The patient's tolerance of                            previous anesthesia was also reviewed. The risks                            and benefits of the procedure and the sedation                            options and risks were discussed with the patient.                            All questions were answered, and informed consent                            was obtained. Prior Anticoagulants: The patient has                            taken no previous anticoagulant or antiplatelet                            agents. ASA Grade Assessment: II - A patient with                            mild systemic disease. After reviewing the risks                            and benefits, the patient was deemed in                            satisfactory condition to undergo the procedure.  After obtaining informed consent, the colonoscope                            was passed under direct vision. Throughout the                            procedure, the patient's blood pressure, pulse, and                            oxygen saturations were monitored continuously. The                            CF [id number] #[id number] was introduced through the anus                            and advanced to the  the cecum, identified by                            appendiceal orifice and ileocecal valve. The                            colonoscopy was performed without difficulty. The                            patient tolerated the procedure well. The quality                            of the bowel preparation was adequate. The                            ileocecal valve, appendiceal orifice, and rectum                            were photographed. Scope In: 8:23:21 AM Scope Out: 8:41:09 AM Scope Withdrawal Time: 0 hours 15 minutes 20 seconds  Total Procedure Duration: 0 hours 17 minutes 48 seconds  Findings:                 The perianal and digital rectal examinations were                            normal. Pertinent negatives include normal                            sphincter tone and no palpable rectal lesions.                           A 12 mm polyp was found in the ascending colon. The                            polyp was sessile. The polyp was removed with a                            cold snare. Resection and retrieval were complete.  Estimated blood loss was minimal.                           A 6 mm polyp was found in the descending colon. The                            polyp was sessile. The polyp was removed with a                            cold snare. Resection and retrieval were complete.                            Estimated blood loss was minimal.                           Multiple small-mouthed diverticula were found in                            the sigmoid colon and descending colon.                           The exam was otherwise normal throughout the                            examined colon.                           Non-bleeding internal hemorrhoids were found during                            retroflexion. The hemorrhoids were Grade I                            (internal hemorrhoids that do not prolapse).                           No additional  abnormalities were found on                            retroflexion. Complications:            No immediate complications. Estimated Blood Loss:     Estimated blood loss was minimal. Impression:               - One 12 mm polyp in the ascending colon, removed                            with a cold snare. Resected and retrieved.                           - One 6 mm polyp in the descending colon, removed                            with a cold snare. Resected and retrieved.                           -  Diverticulosis in the sigmoid colon and in the                            descending colon.                           - Non-bleeding internal hemorrhoids. Recommendation:           - Patient has a contact number available for                            emergencies. The signs and symptoms of potential                            delayed complications were discussed with the                            patient. Return to normal activities tomorrow.                            Written discharge instructions were provided to the                            patient.                           - Resume previous diet.                           - Continue present medications.                           - Await pathology results.                           - Repeat colonoscopy date to be determined after                            pending pathology results are reviewed for                            surveillance. Gean Larose E. Candis Schatz, MD 05/04/2021 8:47:10 AM This report has been signed electronically.

## 2021-05-04 NOTE — Patient Instructions (Signed)
Resume previous diet and medications. Awaiting pathology results. Repeat colonoscopy date to be determined based on pathology results.  YOU HAD AN ENDOSCOPIC PROCEDURE TODAY AT [facility]:   Refer to the procedure report that was given to you for any specific questions about what was found during the examination.  If the procedure report does not answer your questions, please call your gastroenterologist to clarify.  If you requested that your care partner not be given the details of your procedure findings, then the procedure report has been included in a sealed envelope for you to review at your convenience later.  YOU SHOULD EXPECT: Some feelings of bloating in the abdomen. Passage of more gas than usual.  Walking can help get rid of the air that was put into your GI tract during the procedure and reduce the bloating. If you had a lower endoscopy (such as a colonoscopy or flexible sigmoidoscopy) you may notice spotting of blood in your stool or on the toilet paper. If you underwent a bowel prep for your procedure, you may not have a normal bowel movement for a few days.  Please Note:  You might notice some irritation and congestion in your nose or some drainage.  This is from the oxygen used during your procedure.  There is no need for concern and it should clear up in a day or so.  SYMPTOMS TO REPORT IMMEDIATELY:  Following lower endoscopy (colonoscopy or flexible sigmoidoscopy):  Excessive amounts of blood in the stool  Significant tenderness or worsening of abdominal pains  Swelling of the abdomen that is new, acute  Fever of 100F or higher  For urgent or emergent issues, a gastroenterologist can be reached at any hour by calling [phone]. Do not use MyChart messaging for urgent concerns.    DIET:  We do recommend a small meal at first, but then you may proceed to your regular diet.  Drink plenty of fluids but you should avoid alcoholic beverages for 24  hours.  ACTIVITY:  You should plan to take it easy for the rest of today and you should NOT DRIVE or use heavy machinery until tomorrow (because of the sedation medicines used during the test).    FOLLOW UP: Our staff will call the number listed on your records 48-72 hours following your procedure to check on you and address any questions or concerns that you may have regarding the information given to you following your procedure. If we do not reach you, we will leave a message.  We will attempt to reach you two times.  During this call, we will ask if you have developed any symptoms of COVID 19. If you develop any symptoms (ie: fever, flu-like symptoms, shortness of breath, cough etc.) before then, please call [phone].  If you test positive for Covid 19 in the 2 weeks post procedure, please call and report this information to [place].    If any biopsies were taken you will be contacted by phone or by letter within the next 1-3 weeks.  Please call us at [phone] if you have not heard about the biopsies in 3 weeks.    SIGNATURES/CONFIDENTIALITY: You and/or your care partner have signed paperwork which will be entered into your electronic medical record.  These signatures attest to the fact that that the information above on your After Visit Summary has been reviewed and is understood.  Full responsibility of the confidentiality of this discharge information lies with you and/or your care-partner.

## 2021-05-04 NOTE — Progress Notes (Signed)
VS AJ  Pt's states no medical or surgical changes since previsit or office visit.

## 2021-05-04 NOTE — Progress Notes (Signed)
[facility] History and Physical   Primary Care Physician:  [name], NP   Reason for Procedure:   Colon cancer screening  Plan:    Screening colonoscopy     HPI: Kristopher Mendez is a 50 y.o. male undergoing initial average risk screening colonoscopy.  He has no family history of colon cancer and no chronic GI symptoms.    Past Medical History:  Diagnosis Date   Allergy    Dysplastic nevus    left shoulder   Hemorrhoid 05/09/2013   Hyperlipidemia    Restless leg syndrome    Vitamin D deficiency     Past Surgical History:  Procedure Laterality Date   HEMORROIDECTOMY  05/09/2013   SHOULDER SURGERY Right    spurs   SKIN CANCER EXCISION Left 12/07/2012   SHOULDER   THROAT SURGERY      Prior to Admission medications   Medication Sig Start Date End Date Taking? Authorizing Provider  Ibuprofen (ADVIL PO) Take by mouth.   Yes [provider]  rOPINIRole (REQUIP) 2 MG tablet 2 tablets daily. 06/05/18  Yes [provider]  rosuvastatin (CRESTOR) 5 MG tablet 1 tablet daily. 12/07/20  Yes [provider]  terbinafine (LAMISIL) 250 MG tablet Take 250 mg by mouth daily.   Yes [provider]    Current Outpatient Medications  Medication Sig Dispense Refill   Ibuprofen (ADVIL PO) Take by mouth.     rOPINIRole (REQUIP) 2 MG tablet 2 tablets daily.     rosuvastatin (CRESTOR) 5 MG tablet 1 tablet daily.     terbinafine (LAMISIL) 250 MG tablet Take 250 mg by mouth daily.     Current Facility-Administered Medications  Medication Dose Route Frequency Provider Last Rate Last Admin   0.9 %  sodium chloride infusion  500 mL Intravenous Once Daryel November, MD        Allergies as of 05/04/2021   (No Known Allergies)    Family History  Problem Relation Age of Onset   Hyperlipidemia Mother    Heart disease Mother 35   Hypertension Mother    Diabetes Father    Hyperlipidemia Father    Heart disease Father 82    Hypertension Father    Heart disease Brother    Hyperlipidemia Brother    Hypertension Brother    Cancer Maternal Grandmother        lymphoma   Hypertension Other    Heart disease Other    Hyperlipidemia Other    Colon cancer Neg Hx    Esophageal cancer Neg Hx    Stomach cancer Neg Hx    Rectal cancer Neg Hx     Social History   Socioeconomic History   Marital status: Married    Spouse name: Not on file   Number of children: Not on file   Years of education: Not on file   Highest education level: Not on file  Occupational History   Not on file  Tobacco Use   Smoking status: Never   Smokeless tobacco: Never  Vaping Use   Vaping Use: Never used  Substance and Sexual Activity   Alcohol use: No   Drug use: No   Sexual activity: Not on file  Other Topics Concern   Not on file  Social History Narrative   Not on file   Social Determinants of Health   Financial Resource Strain: Not on file  Food Insecurity: Not on file  Transportation Needs: Not on file  Physical  Activity: Not on file  Stress: Not on file  Social Connections: Not on file  Intimate Partner Violence: Not on file    Review of Systems:  All other review of systems negative except as mentioned in the HPI.  Physical Exam: Vital signs BP (!) 139/100    Pulse 77    Temp (!) 97.5 F (36.4 C)    Resp (!) 9    Ht 6\' 1"  (1.854 m)    Wt 257 lb (116.6 kg)    SpO2 98%    BMI 33.91 kg/m   General:   Alert,  Well-developed, well-nourished, pleasant and cooperative in NAD Airway:  Mallampati 2 Lungs:  Clear throughout to auscultation.   Heart:  Regular rate and rhythm; no murmurs, clicks, rubs,  or gallops. Abdomen:  Soft, nontender and nondistended. Normal bowel sounds.   Neuro/Psych:  Normal mood and affect. A and O x 3   Shaquoya Cosper E. Candis Schatz, MD [facility]

## 2021-05-04 NOTE — Progress Notes (Signed)
Report to PACU, RN, vss, BBS= Clear.  

## 2021-05-04 NOTE — Progress Notes (Signed)
[facility] History and Physical   Primary Care Physician:  [name], NP   Reason for Procedure:   Colon cancer screening  Plan:    Screening colonoscopy     HPI: Kristopher Mendez is a 50 y.o. male undergoing initial average risk screening colonoscopy.  He has no family history of colon cancer and no chronic GI symptoms.    Past Medical History:  Diagnosis Date   Allergy    Dysplastic nevus    left shoulder   Hemorrhoid 05/09/2013   Hyperlipidemia    Restless leg syndrome    Vitamin D deficiency     Past Surgical History:  Procedure Laterality Date   HEMORROIDECTOMY  05/09/2013   SHOULDER SURGERY Right    spurs   SKIN CANCER EXCISION Left 12/07/2012   SHOULDER   THROAT SURGERY      Prior to Admission medications   Medication Sig Start Date End Date Taking? Authorizing Provider  Ibuprofen (ADVIL PO) Take by mouth.    [provider]  Na Sulfate-K Sulfate-Mg Sulf 17.5-3.13-1.6 GM/177ML SOLN Take 1 kit by mouth as directed. May use generic Suprep 04/19/21   Daryel November, MD  rOPINIRole (REQUIP) 2 MG tablet 2 tablets daily. 06/05/18   [provider]  rosuvastatin (CRESTOR) 5 MG tablet 1 tablet daily. 12/07/20   [provider]  terbinafine (LAMISIL) 250 MG tablet Take 250 mg by mouth daily.    [provider]    Current Outpatient Medications  Medication Sig Dispense Refill   Ibuprofen (ADVIL PO) Take by mouth.     Na Sulfate-K Sulfate-Mg Sulf 17.5-3.13-1.6 GM/177ML SOLN Take 1 kit by mouth as directed. May use generic Suprep 354 mL 0   rOPINIRole (REQUIP) 2 MG tablet 2 tablets daily.     rosuvastatin (CRESTOR) 5 MG tablet 1 tablet daily.     terbinafine (LAMISIL) 250 MG tablet Take 250 mg by mouth daily.     No current facility-administered medications for this visit.    Allergies as of 05/04/2021   (No Known Allergies)    Family History  Problem Relation Age of Onset   Hyperlipidemia Mother     Heart disease Mother 8   Hypertension Mother    Diabetes Father    Hyperlipidemia Father    Heart disease Father 40   Hypertension Father    Heart disease Brother    Hyperlipidemia Brother    Hypertension Brother    Cancer Maternal Grandmother        lymphoma   Hypertension Other    Heart disease Other    Hyperlipidemia Other    Colon cancer Neg Hx    Esophageal cancer Neg Hx    Stomach cancer Neg Hx    Rectal cancer Neg Hx     Social History   Socioeconomic History   Marital status: Married    Spouse name: Not on file   Number of children: Not on file   Years of education: Not on file   Highest education level: Not on file  Occupational History   Not on file  Tobacco Use   Smoking status: Never   Smokeless tobacco: Never  Vaping Use   Vaping Use: Never used  Substance and Sexual Activity   Alcohol use: No   Drug use: No   Sexual activity: Not on file  Other Topics Concern   Not on file  Social History Narrative   Not on file   Social Determinants of Health  Financial Resource Strain: Not on file  Food Insecurity: Not on file  Transportation Needs: Not on file  Physical Activity: Not on file  Stress: Not on file  Social Connections: Not on file  Intimate Partner Violence: Not on file    Review of Systems:  All other review of systems negative except as mentioned in the HPI.  Physical Exam: Vital signs There were no vitals taken for this visit.  General:   Alert,  Well-developed, well-nourished, pleasant and cooperative in NAD Airway:  Mallampati 2 Lungs:  Clear throughout to auscultation.   Heart:  Regular rate and rhythm; no murmurs, clicks, rubs,  or gallops. Abdomen:  Soft, nontender and nondistended. Normal bowel sounds.   Neuro/Psych:  Normal mood and affect. A and O x 3   Oriya Kettering E. Candis Schatz, MD [facility]

## 2021-05-06 ENCOUNTER — Telehealth: Payer: Self-pay

## 2021-05-06 NOTE — Telephone Encounter (Signed)
°  Follow up Call-  Call back number 05/04/2021  Post procedure Call Back phone  # [phone]  Permission to leave phone message Yes  Some recent data might be hidden     Patient questions:  Do you have a fever, pain , or abdominal swelling? No. Pain Score  0 *  Have you tolerated food without any problems? Yes.    Have you been able to return to your normal activities? Yes.    Do you have any questions about your discharge instructions: Diet   No. Medications  No. Follow up visit  No.  Do you have questions or concerns about your Care? No.  Actions: * If pain score is 4 or above: No action needed, pain <4.

## 2021-05-12 NOTE — Progress Notes (Signed)
Kristopher Mendez,  One of the polyps that I removed during your recent procedure was completely benign but was proven to be a "pre-cancerous" polyp that MAY have grown into cancers if it had not been removed.  Studies shows that at least 20% of women over age 49 and 30% of men over age 97 have pre-cancerous polyps.  Based on current nationally recognized surveillance guidelines, because of the size of the polyp, I recommend that you have a repeat colonoscopy in 3 years.   If you develop any new rectal bleeding, abdominal pain or significant bowel habit changes, please contact me before then.

## 2021-12-23 ENCOUNTER — Emergency Department (HOSPITAL_COMMUNITY)
Admission: EM | Admit: 2021-12-23 | Discharge: 2021-12-24 | Disposition: A | Discharge status: Home / Self Care | Payer: BC Managed Care – PPO | Attending: Emergency Medicine | Admitting: Emergency Medicine

## 2021-12-23 ENCOUNTER — Emergency Department (HOSPITAL_COMMUNITY): Payer: BC Managed Care – PPO

## 2021-12-23 DIAGNOSIS — Z7982 Long term (current) use of aspirin: Secondary | ICD-10-CM | POA: Diagnosis not present

## 2021-12-23 DIAGNOSIS — R110 Nausea: Secondary | ICD-10-CM | POA: Diagnosis not present

## 2021-12-23 DIAGNOSIS — R197 Diarrhea, unspecified: Secondary | ICD-10-CM | POA: Insufficient documentation

## 2021-12-23 DIAGNOSIS — R079 Chest pain, unspecified: Secondary | ICD-10-CM | POA: Insufficient documentation

## 2021-12-23 DIAGNOSIS — R55 Syncope and collapse: Secondary | ICD-10-CM | POA: Diagnosis present

## 2021-12-23 LAB — CBC WITH DIFFERENTIAL/PLATELET
Abs Immature Granulocytes: 0.05 10*3/uL (ref 0.00–0.07)
Basophils Absolute: 0.1 10*3/uL (ref 0.0–0.1)
Basophils Relative: 1 %
Eosinophils Absolute: 0 10*3/uL (ref 0.0–0.5)
Eosinophils Relative: 0 %
HCT: 42 % (ref 39.0–52.0)
Hemoglobin: 14.6 g/dL (ref 13.0–17.0)
Immature Granulocytes: 0 %
Lymphocytes Relative: 36 %
Lymphs Abs: 4.4 10*3/uL — ABNORMAL HIGH (ref 0.7–4.0)
MCH: 32.2 pg (ref 26.0–34.0)
MCHC: 34.8 g/dL (ref 30.0–36.0)
MCV: 92.7 fL (ref 80.0–100.0)
Monocytes Absolute: 0.8 10*3/uL (ref 0.1–1.0)
Monocytes Relative: 7 %
Neutro Abs: 6.8 10*3/uL (ref 1.7–7.7)
Neutrophils Relative %: 56 %
Platelets: 203 10*3/uL (ref 150–400)
RBC: 4.53 MIL/uL (ref 4.22–5.81)
RDW: 12 % (ref 11.5–15.5)
WBC: 12.2 10*3/uL — ABNORMAL HIGH (ref 4.0–10.5)
nRBC: 0 % (ref 0.0–0.2)

## 2021-12-23 MED ORDER — LACTATED RINGERS IV BOLUS
1000.0000 mL | Freq: Once | INTRAVENOUS | Status: AC
  Administered 2021-12-23: 1000 mL via INTRAVENOUS

## 2021-12-23 MED ORDER — ONDANSETRON HCL 4 MG/2ML IJ SOLN
4.0000 mg | Freq: Once | INTRAMUSCULAR | Status: AC
  Administered 2021-12-23: 4 mg via INTRAVENOUS
  Filled 2021-12-23: qty 2

## 2021-12-23 MED ORDER — DIPHENHYDRAMINE HCL 50 MG/ML IJ SOLN
50.0000 mg | Freq: Once | INTRAMUSCULAR | Status: AC
  Administered 2021-12-23: 50 mg via INTRAVENOUS
  Filled 2021-12-23: qty 1

## 2021-12-23 NOTE — ED Provider Notes (Signed)
[facility] DE[facility] Provider Note   CSN: [id number] Arrival date & time: 12/23/21  2256     History  Chief Complaint  Patient presents with   Loss of Consciousness   Chest Pain   Nausea    COULTER OLDAKER is a 51 y.o. male.  Presents to the emergency department for evaluation of syncope.  Patient reports that he had a cardiac CT earlier today.  Tonight he had onset of nausea.  He then felt like he might be about to have diarrhea.  He went to the bathroom and at some point passed out.  Upon awakening he was noticing shortness of breath and chest pain.       Home Medications Prior to Admission medications   Medication Sig Start Date End Date Taking? Authorizing Provider  aspirin EC 81 MG tablet Take 81 mg by mouth daily. Swallow whole.   Yes [provider]  rOPINIRole (REQUIP) 2 MG tablet Take 2 mg by mouth daily. 06/05/18  Yes [provider]  rosuvastatin (CRESTOR) 5 MG tablet Take 5 mg by mouth daily. 12/07/20  Yes [provider]  terbinafine (LAMISIL) 250 MG tablet Take 250 mg by mouth daily.   Yes [provider]      Allergies    Patient has no known allergies.    Review of Systems   Review of Systems  Physical Exam Updated Vital Signs BP 113/76   Pulse 79   Temp 97.6 F (36.4 C) (Oral)   Resp 18   Ht '6\' 1"'$  (1.854 m)   Wt 119.7 kg   SpO2 93%   BMI 34.83 kg/m  Physical Exam Vitals and nursing note reviewed.  Constitutional:      General: He is not in acute distress.    Appearance: He is well-developed.  HENT:     Head: Normocephalic and atraumatic.     Mouth/Throat:     Mouth: Mucous membranes are moist.  Eyes:     General: Vision grossly intact. Gaze aligned appropriately.     Extraocular Movements: Extraocular movements intact.     Conjunctiva/sclera: Conjunctivae normal.  Cardiovascular:     Rate and Rhythm: Normal rate and regular rhythm.     Pulses: Normal pulses.     Heart  sounds: Normal heart sounds, S1 normal and S2 normal. No murmur heard.    No friction rub. No gallop.  Pulmonary:     Effort: Pulmonary effort is normal. No respiratory distress.     Breath sounds: Normal breath sounds.  Abdominal:     Palpations: Abdomen is soft.     Tenderness: There is no abdominal tenderness. There is no guarding or rebound.     Hernia: No hernia is present.  Musculoskeletal:        General: No swelling.     Cervical back: Full passive range of motion without pain, normal range of motion and neck supple. No pain with movement, spinous process tenderness or muscular tenderness. Normal range of motion.     Right lower leg: No edema.     Left lower leg: No edema.  Skin:    General: Skin is warm and dry.     Capillary Refill: Capillary refill takes less than 2 seconds.     Findings: No ecchymosis, erythema, lesion or wound.  Neurological:     Mental Status: He is alert and oriented to person, place, and time.     GCS: GCS eye subscore is 4. GCS  verbal subscore is 5. GCS motor subscore is 6.     Cranial Nerves: Cranial nerves 2-12 are intact.     Sensory: Sensation is intact.     Motor: Motor function is intact. No weakness or abnormal muscle tone.     Coordination: Coordination is intact.  Psychiatric:        Mood and Affect: Mood normal.        Speech: Speech normal.        Behavior: Behavior normal.     ED Results / Procedures / Treatments   Labs (all labs ordered are listed, but only abnormal results are displayed) Labs Reviewed  CBC WITH DIFFERENTIAL/PLATELET - Abnormal; Notable for the following components:      Result Value   WBC 12.2 (*)    Lymphs Abs 4.4 (*)    All other components within normal limits  COMPREHENSIVE METABOLIC PANEL - Abnormal; Notable for the following components:   Potassium 3.2 (*)    CO2 19 (*)    Glucose, Bld 139 (*)    Creatinine, Ser 1.39 (*)    Calcium 8.4 (*)    Total Protein 6.1 (*)    Albumin 3.4 (*)    All other  components within normal limits  LACTIC ACID, PLASMA - Abnormal; Notable for the following components:   Lactic Acid, Venous 4.9 (*)    All other components within normal limits  LIPASE, BLOOD  BRAIN NATRIURETIC PEPTIDE  URINALYSIS, ROUTINE W REFLEX MICROSCOPIC  LACTIC ACID, PLASMA  TROPONIN I (HIGH SENSITIVITY)  TROPONIN I (HIGH SENSITIVITY)    EKG EKG Interpretation  Date/Time:  Thursday December 23 2021 23:08:35 EDT Ventricular Rate:  83 PR Interval:  180 QRS Duration: 112 QT Interval:  403 QTC Calculation: 474 R Axis:   54 Text Interpretation: Sinus rhythm Borderline intraventricular conduction delay Confirmed by Orpah Greek [phone]) on 12/24/2021 12:57:19 AM  Radiology DG Chest Port 1 View  Result Date: 12/23/2021 CLINICAL DATA:  Chest pain EXAM: PORTABLE CHEST 1 VIEW COMPARISON:  None Available. FINDINGS: The heart size and mediastinal contours are within normal limits. Both lungs are clear. The visualized skeletal structures are unremarkable. IMPRESSION: No active disease. Electronically Signed   By: Inez Catalina M.D.   On: 12/23/2021 23:37    Procedures Procedures    Medications Ordered in ED Medications  lactated ringers bolus 1,000 mL (0 mLs Intravenous Stopped 12/24/21 0148)  ondansetron (ZOFRAN) injection 4 mg (4 mg Intravenous Given 12/23/21 2347)  diphenhydrAMINE (BENADRYL) injection 50 mg (50 mg Intravenous Given 12/23/21 2347)    ED Course/ Medical Decision Making/ A&P                           Medical Decision Making Amount and/or Complexity of Data Reviewed Labs: ordered. Radiology: ordered.  Risk Prescription drug management.   Presents to the emergency department for evaluation of syncopal episode.  Patient reports that he had a cardiac CT performed earlier in the day.  Tonight he had onset of nausea followed by urge to defecate and then passed out in the bathroom.  Orthostatic hypotension/vasovagal syncope considered.  Cardiac syncope  felt to be less likely.  With patient's recent cardiac CT at IV contrast administration, allergic reaction considered.  Given IV Benadryl.  Patient does not have any other allergic symptoms, no rash, no swelling.  Patient's vital signs essentially normal at arrival, afebrile, normotensive, not tachycardic.  Work-up initiated.  Patient with slight leukocytosis  noted on CBC.  Remainder of CBC unremarkable.  Chemistries reveal slightly increased creatinine.  Patient did have an elevated lactic acid.  This raises suspicion for possible sepsis versus seizure.  Urinalysis does not show signs of infection.  Chest x-ray clear, no fever and no infectious findings on work-up.  Patient administered IV fluids for presumed dehydration, repeat lactic acid has cleared.          Final Clinical Impression(s) / ED Diagnoses Final diagnoses:  Syncope, unspecified syncope type    Rx / DC Orders ED Discharge Orders     None         Azad Calame, Gwenyth Allegra, MD 12/24/21 [phone]

## 2021-12-23 NOTE — ED Triage Notes (Signed)
Patient BIB EMS, reports going to have a bowle movement and passed out and woke up on the floor and experiencing chest pain. Patient given 324 asa and 1 0.'4mg'$  SL nitro.

## 2021-12-24 ENCOUNTER — Telehealth: Payer: Self-pay

## 2021-12-24 LAB — COMPREHENSIVE METABOLIC PANEL
ALT: 22 U/L (ref 0–44)
AST: 20 U/L (ref 15–41)
Albumin: 3.4 g/dL — ABNORMAL LOW (ref 3.5–5.0)
Alkaline Phosphatase: 89 U/L (ref 38–126)
Anion gap: 11 (ref 5–15)
BUN: 11 mg/dL (ref 6–20)
CO2: 19 mmol/L — ABNORMAL LOW (ref 22–32)
Calcium: 8.4 mg/dL — ABNORMAL LOW (ref 8.9–10.3)
Chloride: 110 mmol/L (ref 98–111)
Creatinine, Ser: 1.39 mg/dL — ABNORMAL HIGH (ref 0.61–1.24)
GFR, Estimated: >60 mL/min (ref >60)
Glucose, Bld: 139 mg/dL — ABNORMAL HIGH (ref 70–99)
Potassium: 3.2 mmol/L — ABNORMAL LOW (ref 3.5–5.1)
Sodium: 140 mmol/L (ref 135–145)
Total Bilirubin: 0.8 mg/dL (ref 0.3–1.2)
Total Protein: 6.1 g/dL — ABNORMAL LOW (ref 6.5–8.1)

## 2021-12-24 LAB — URINALYSIS, ROUTINE W REFLEX MICROSCOPIC
Bilirubin Urine: NEGATIVE
Glucose, UA: NEGATIVE mg/dL
Hgb urine dipstick: NEGATIVE
Ketones, ur: NEGATIVE mg/dL
Leukocytes,Ua: NEGATIVE
Nitrite: NEGATIVE
Protein, ur: NEGATIVE mg/dL
Specific Gravity, Urine: 1.012 (ref 1.005–1.030)
pH: 7 (ref 5.0–8.0)

## 2021-12-24 LAB — LACTIC ACID, PLASMA
Lactic Acid, Venous: 4.9 mmol/L (ref 0.5–1.9)
Lactic Acid, Venous: 1.2 mmol/L (ref 0.5–1.9)

## 2021-12-24 LAB — BRAIN NATRIURETIC PEPTIDE: B Natriuretic Peptide: 4.2 pg/mL (ref 0.0–100.0)

## 2021-12-24 LAB — TROPONIN I (HIGH SENSITIVITY)
Troponin I (High Sensitivity): 6 ng/L (ref <18)
Troponin I (High Sensitivity): 3 ng/L (ref <18)

## 2021-12-24 LAB — LIPASE, BLOOD: Lipase: 33 U/L (ref 11–51)

## 2021-12-24 NOTE — Discharge Instructions (Addendum)
Please schedule follow-up with your cardiologist to further evaluate you for your syncopal episode.  Additionally, I have referred you to neurology for further evaluation as well.

## 2021-12-24 NOTE — Telephone Encounter (Signed)
NOTES ATTACHED TO REFERRAL

## 2021-12-28 ENCOUNTER — Encounter: Payer: Self-pay | Admitting: Cardiovascular Disease

## 2021-12-28 ENCOUNTER — Ambulatory Visit: Payer: BC Managed Care – PPO | Admitting: Cardiovascular Disease

## 2021-12-28 VITALS — BP 118/82 | HR 90 | Ht 73.0 in | Wt 258.6 lb

## 2021-12-28 DIAGNOSIS — R110 Nausea: Secondary | ICD-10-CM

## 2021-12-28 DIAGNOSIS — R931 Abnormal findings on diagnostic imaging of heart and coronary circulation: Secondary | ICD-10-CM | POA: Diagnosis not present

## 2021-12-28 DIAGNOSIS — R531 Weakness: Secondary | ICD-10-CM | POA: Diagnosis not present

## 2021-12-28 DIAGNOSIS — I200 Unstable angina: Secondary | ICD-10-CM | POA: Diagnosis not present

## 2021-12-28 MED ORDER — ISOSORBIDE MONONITRATE ER 30 MG PO TB24: 30.0000 mg | ORAL_TABLET | Freq: Every day | ORAL | 3 refills | Status: DC

## 2021-12-28 NOTE — Progress Notes (Signed)
Cardiology Office Note:    Date:  12/28/2021   ID:  Kristopher Mendez, DOB [birth year], MRN [id number]  PCP:  [name], NP   [facility] Providers Cardiologist:  Malon Siddall  Click to update primary MD,subspecialty MD or APP then REFRESH:1}    Referring MD: [name], NP   Chief Complaint  Patient presents with   coronary calcification     History of Present Illness:    Kristopher Mendez is a 51 y.o. male with a hx of hyperlipidemia.  He has a family history of coronary artery disease.  Seen with Marcelino Scot   He had an episode of syncope and presented to the ER on August 17. He had a cardiac CT scan that day.  He had onset of nausea following the CT scan.  He also had diarrhea.  He went to the bathroom and at some point passed out.  He woke up and noticed that he was at some chest pain and shortness of breath. Coronary artery calcium score was performed at a [facility] facility.  This CAC was 957.  - Total Calcium Score: 957.  -- Left Main: 8.  -- Left Anterior Descending: 408.  -- Circumflex: 0.  -- Right Coronary: 541  -- Posterior Descending Artery: 0     Woke up from the cough,    Thought he needed to have bowel movement , then thought he was going to vomit. Then sat back against the wall, Went to the ER   This is the 5th time he has had a similar episode   Is a Well[employer] geologist  Has chronic fatigue,  Tires easily  Used to [employer],  now does not do much exercise   Has more DOE now   + fam hx of CAd Father had 2 MIs in 50s , 54 s Uncle had MI in his 75s Multiple male members have had premature CAD   Labs at [place]    Hx symptoms seem to be progressing  Having more and more nausea, weakness, dyspnea.     Lipoprotein (A) <75.0 nmol/L 247.8 High     Cholesterol, Total 100 - 199 mg/dL 150    Triglycerides 0 - 149 mg/dL 71    HDL >39 mg/dL 40    VLDL Cholesterol Cal 5 - 40 mg/dL 14    LDL 0 - 99 mg/dL 96    LDL/HDL Ratio 0.0 - 3.6  ratio 2.4      Grandfather died of cardiac arrest at 30    Past Medical History:  Diagnosis Date   Allergy    COVID-19    Dysplastic nevus    left shoulder   Family history of heart disease in male family member before age 78    Hemorrhoid 05/09/2013   Hyperlipidemia    Palpitations    Restless leg syndrome    Vitamin D deficiency     Past Surgical History:  Procedure Laterality Date   HEMORROIDECTOMY  05/09/2013   SHOULDER SURGERY Right    spurs   SKIN CANCER EXCISION Left 12/07/2012   SHOULDER   THROAT SURGERY      Current Medications: Current Meds  Medication Sig   aspirin EC 81 MG tablet Take 81 mg by mouth daily. Swallow whole.   isosorbide mononitrate (IMDUR) 30 MG 24 hr tablet Take 1 tablet (30 mg total) by mouth daily.   ondansetron (ZOFRAN-ODT) 4 MG disintegrating tablet Take by mouth.   rOPINIRole (REQUIP) 2 MG  tablet Take 2 mg by mouth daily.   rosuvastatin (CRESTOR) 5 MG tablet Take 5 mg by mouth daily.   terbinafine (LAMISIL) 250 MG tablet Take 250 mg by mouth daily.     Allergies:   Patient has no known allergies.   Social History   Socioeconomic History   Marital status: Married    Spouse name: Not on file   Number of children: Not on file   Years of education: Not on file   Highest education level: Not on file  Occupational History   Not on file  Tobacco Use   Smoking status: Never   Smokeless tobacco: Never  Vaping Use   Vaping Use: Never used  Substance and Sexual Activity   Alcohol use: No   Drug use: No   Sexual activity: Not on file  Other Topics Concern   Not on file  Social History Narrative   Not on file   Social Determinants of Health   Financial Resource Strain: Not on file  Food Insecurity: Not on file  Transportation Needs: Not on file  Physical Activity: Not on file  Stress: Not on file  Social Connections: Not on file     Family History: The patient's family history includes Cancer in his maternal grandmother;  Diabetes in his father; Heart disease in his brother and another family member; Heart disease (age of onset: 21) in his mother; Heart disease (age of onset: 48) in his father; Hyperlipidemia in his brother, father, mother, and another family member; Hypertension in his brother, father, mother, and another family member. There is no history of Colon cancer, Esophageal cancer, Stomach cancer, or Rectal cancer.  ROS:   Please see the history of present illness.     All other systems reviewed and are negative.  EKGs/Labs/Other Studies Reviewed:    The following studies were reviewed today:   EKG: December 23, 2021: Normal sinus rhythm.  No ST or T wave changes.  Recent Labs: 12/23/2021: ALT 22; B Natriuretic Peptide 4.2; BUN 11; Creatinine, Ser 1.39; Hemoglobin 14.6; Platelets 203; Potassium 3.2; Sodium 140  Recent Lipid Panel    Component Value Date/Time   CHOL 128 11/27/2014 1018   TRIG 89 11/27/2014 1018   HDL 39 (L) 11/27/2014 1018   CHOLHDL 3.3 11/27/2014 1018   VLDL 18 11/27/2014 1018   LDLCALC 71 11/27/2014 1018     Risk Assessment/Calculations:                Physical Exam:    VS:  BP 118/82   Pulse 90   Ht '6\' 1"'$  (1.854 m)   Wt 258 lb 9.6 oz (117.3 kg)   SpO2 95%   BMI 34.12 kg/m     Wt Readings from Last 3 Encounters:  12/28/21 258 lb 9.6 oz (117.3 kg)  12/23/21 264 lb (119.7 kg)  05/04/21 257 lb (116.6 kg)     GEN:  Well nourished, well developed in no acute distress HEENT: Normal NECK: No JVD; No carotid bruits LYMPHATICS: No lymphadenopathy CARDIAC: RRR, no murmurs, rubs, gallops RESPIRATORY:  Clear to auscultation without rales, wheezing or rhonchi  ABDOMEN: Soft, non-tender, non-distended MUSCULOSKELETAL:  No edema; No deformity  SKIN: Warm and dry NEUROLOGIC:  Alert and oriented x 3 PSYCHIATRIC:  Normal affect   ASSESSMENT:    1. Elevated coronary artery calcium score   2. Nausea   3. Weakness   4. Unstable angina (HCC)    PLAN:    In  order of  problems listed above:  Unstable angina: Kristopher Mendez presents with symptoms that are likely anginal equivalent.  He has severe nausea, and feels like he has to vomit.  He has sudden weakness and shortness of breath.  He has been found to have bradycardia and hypotension with these.  While this could be just due to a vagal reaction this also could be due to critical coronary artery disease, especially involving the right coronary artery. His symptoms seem to be progressing fairly quickly. His lipoprotein a is 247 which is extremely high. He has a very strong family history of coronary artery disease and all of the [place] members of his family.  We will schedule him for a heart catheterization.  We have discussed the risk, benefits, options of heart catheterization.  He understands and agrees to proceed.  2.  Hyperlipidemia: His last LDL is fairly well controlled on rosuvastatin.  He is lipoprotein a is 247.  I think that we should refer him to the lipid clinic for consideration of a PCSK9 inhibitor.       Shared Decision Making/Informed Consent{   The risks [stroke (1 in 1000), death (1 in 1000), kidney failure [usually temporary] (1 in 500), bleeding (1 in 200), allergic reaction [possibly serious] (1 in 200)], benefits (diagnostic support and management of coronary artery disease) and alternatives of a cardiac catheterization were discussed in detail with Kristopher Mendez and he is willing to proceed.    Medication Adjustments/Labs and Tests Ordered: Current medicines are reviewed at length with the patient today.  Concerns regarding medicines are outlined above.  Orders Placed This Encounter  Procedures   AMB Referral to [facility] Pharm-D   Meds ordered this encounter  Medications   isosorbide mononitrate (IMDUR) 30 MG 24 hr tablet    Sig: Take 1 tablet (30 mg total) by mouth daily.    Dispense:  90 tablet    Refill:  3    Patient Instructions  Medication Instructions:  START  Isosorbide Mononitrate '30mg'$  daily *If you need a refill on your cardiac medications before your next appointment, please call your pharmacy*   Lab Work: NONE If you have labs (blood work) drawn today and your tests are completely normal, you will receive your results only by: [place] (if you have MyChart) OR A paper copy in the mail If you have any lab test that is abnormal or we need to change your treatment, we will call you to review the results.   Testing/Procedures: Ambulatory Referral to [place] heart Cath Your physician has requested that you have a cardiac catheterization. Cardiac catheterization is used to diagnose and/or treat various heart conditions. Doctors may recommend this procedure for a number of different reasons. The most common reason is to evaluate chest pain. Chest pain can be a symptom of coronary artery disease (CAD), and cardiac catheterization can show whether plaque is narrowing or blocking your heart's arteries. This procedure is also used to evaluate the valves, as well as measure the blood flow and oxygen levels in different parts of your heart. For further information please visit HugeFiesta.tn. Please follow instruction sheet, as given.  Follow-Up: At [facility], you and your health needs are our priority.  As part of our continuing mission to provide you with exceptional heart care, we have created designated Provider Care Teams.  These Care Teams include your primary Cardiologist (physician) and Advanced Practice Providers (APPs -  Physician Assistants and Nurse Practitioners) who all work together to provide you  with the care you need, when you need it.  We recommend signing up for the patient portal called "MyChart".  Sign up information is provided on this After Visit Summary.  MyChart is used to connect with patients for Virtual Visits (Telemedicine).  Patients are able to view lab/test results, encounter notes, upcoming  appointments, etc.  Non-urgent messages can be sent to your provider as well.   To learn more about what you can do with MyChart, go to [email or web address].    Your next appointment:   1 month(s)  The format for your next appointment:   In Person  Provider:   Mertie Moores, MD   Other Instructions  [place] OFFICE [place], [place] [address] Dept: [phone] Loc: [place]  12/28/2021  You are scheduled for a Cardiac Catheterization on Wednesday, August 23 with Dr. Larae Grooms.  1. Please arrive at the Main Entrance A at [facility]: [place], [place] [address] at 12:30 PM (This time is two hours before your procedure to ensure your preparation). Free valet parking service is available.   Special note: Every effort is made to have your procedure done on time. Please understand that emergencies sometimes delay scheduled procedures.  2. Diet: Do not eat solid foods after midnight.  You may have clear liquids until 5 AM upon the day of the procedure.  3. Labs: NONE  4. Medication instructions in preparation for your procedure:   Contrast Allergy: No  On the morning of your procedure, take Aspirin '81mg'$  and any morning medicines NOT listed above.  You may use sips of water.  5. Plan to go home the same day, you will only stay overnight if medically necessary. 6. You MUST have a responsible adult to drive you home. 7. An adult MUST be with you the first 24 hours after you arrive home. 8. Bring a current list of your medications, and the last time and date medication taken. 9. Bring ID and current insurance cards. 10.Please wear clothes that are easy to get on and off and wear slip-on shoes.  Thank you for allowing [place] to care for you!   -- [place] [facility]   Important Information About  [place]         Signed, Mertie Moores, MD  12/28/2021 6:33 PM    [place]

## 2021-12-28 NOTE — Patient Instructions (Signed)
Medication Instructions:  START Isosorbide Mononitrate '30mg'$  daily *If you need a refill on your cardiac medications before your next appointment, please call your pharmacy*   Lab Work: NONE If you have labs (blood work) drawn today and your tests are completely normal, you will receive your results only by: [place] (if you have MyChart) OR A paper copy in the mail If you have any lab test that is abnormal or we need to change your treatment, we will call you to review the results.   Testing/Procedures: Ambulatory Referral to [place] heart Cath Your physician has requested that you have a cardiac catheterization. Cardiac catheterization is used to diagnose and/or treat various heart conditions. Doctors may recommend this procedure for a number of different reasons. The most common reason is to evaluate chest pain. Chest pain can be a symptom of coronary artery disease (CAD), and cardiac catheterization can show whether plaque is narrowing or blocking your heart's arteries. This procedure is also used to evaluate the valves, as well as measure the blood flow and oxygen levels in different parts of your heart. For further information please visit HugeFiesta.tn. Please follow instruction sheet, as given.  Follow-Up: At [facility], you and your health needs are our priority.  As part of our continuing mission to provide you with exceptional heart care, we have created designated Provider Care Teams.  These Care Teams include your primary Cardiologist (physician) and Advanced Practice Providers (APPs -  Physician Assistants and Nurse Practitioners) who all work together to provide you with the care you need, when you need it.  We recommend signing up for the patient portal called "MyChart".  Sign up information is provided on this After Visit Summary.  MyChart is used to connect with patients for Virtual Visits (Telemedicine).  Patients are able to view lab/test results,  encounter notes, upcoming appointments, etc.  Non-urgent messages can be sent to your provider as well.   To learn more about what you can do with MyChart, go to [email or web address].    Your next appointment:   1 month(s)  The format for your next appointment:   In Person  Provider:   Mertie Moores, MD   Other Instructions  [place] OFFICE [place], [place] [address] Dept: [phone] Loc: [place]  12/28/2021  You are scheduled for a Cardiac Catheterization on Wednesday, August 23 with Dr. Larae Grooms.  1. Please arrive at the Main Entrance A at [facility]: [place], [place] [address] at 12:30 PM (This time is two hours before your procedure to ensure your preparation). Free valet parking service is available.   Special note: Every effort is made to have your procedure done on time. Please understand that emergencies sometimes delay scheduled procedures.  2. Diet: Do not eat solid foods after midnight.  You may have clear liquids until 5 AM upon the day of the procedure.  3. Labs: NONE  4. Medication instructions in preparation for your procedure:   Contrast Allergy: No  On the morning of your procedure, take Aspirin '81mg'$  and any morning medicines NOT listed above.  You may use sips of water.  5. Plan to go home the same day, you will only stay overnight if medically necessary. 6. You MUST have a responsible adult to drive you home. 7. An adult MUST be with you the first 24 hours after you  arrive home. 8. Bring a current list of your medications, and the last time and date medication taken. 9. Bring ID and current insurance cards. 10.Please wear clothes that are easy to get on and off and wear slip-on shoes.  Thank you for allowing [place] to care for you!   -- [place] [facility]   Important  Information About [place]

## 2021-12-28 NOTE — H&P (View-Only) (Signed)
Cardiology Office Note:    Date:  12/28/2021   ID:  Kristopher Mendez, DOB Jul 19, 1970, MRN [id number]  PCP:  [name], NP   [place] Providers Cardiologist:  Sheanna Dail  Click to update primary MD,subspecialty MD or APP then REFRESH:1}    Referring MD: [name], NP   Chief Complaint  Patient presents with   coronary calcification     History of Present Illness:    Kristopher Mendez is a 51 y.o. male with a hx of hyperlipidemia.  He has a family history of coronary artery disease.  Seen with Marcelino Scot   He had an episode of syncope and presented to the ER on August 17. He had a cardiac CT scan that day.  He had onset of nausea following the CT scan.  He also had diarrhea.  He went to the bathroom and at some point passed out.  He woke up and noticed that he was at some chest pain and shortness of breath. Coronary artery calcium score was performed at a [facility] facility.  This CAC was 957.  - Total Calcium Score: 957.  -- Left Main: 8.  -- Left Anterior Descending: 408.  -- Circumflex: 0.  -- Right Coronary: 541  -- Posterior Descending Artery: 0     Woke up from the cough,    Thought he needed to have bowel movement , then thought he was going to vomit. 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     Lipoprotein (A) <75.0 nmol/L 247.8 High     Cholesterol, Total 100 - 199 mg/dL 150    Triglycerides 0 - 149 mg/dL 71    HDL >39 mg/dL 40    VLDL Cholesterol Cal 5 - 40 mg/dL 14    LDL 0 - 99 mg/dL 96    LDL/HDL Ratio 0.0 - 3.6  ratio 2.4      Grandfather died of cardiac arrest at 80    Past Medical History:  Diagnosis Date   Allergy    COVID-19    Dysplastic nevus    left shoulder   Family history of heart disease in male family member before age 34    Hemorrhoid 05/09/2013   Hyperlipidemia    Palpitations    Restless leg syndrome    Vitamin D deficiency     Past Surgical History:  Procedure Laterality Date   HEMORROIDECTOMY  05/09/2013   SHOULDER SURGERY Right    spurs   SKIN CANCER EXCISION Left 12/07/2012   SHOULDER   THROAT SURGERY      Current Medications: Current Meds  Medication Sig   aspirin EC 81 MG tablet Take 81 mg by mouth daily. Swallow whole.   isosorbide mononitrate (IMDUR) 30 MG 24 hr tablet Take 1 tablet (30 mg total) by mouth daily.   ondansetron (ZOFRAN-ODT) 4 MG disintegrating tablet Take by mouth.   rOPINIRole (REQUIP) 2 MG  tablet Take 2 mg by mouth daily.   rosuvastatin (CRESTOR) 5 MG tablet Take 5 mg by mouth daily.   terbinafine (LAMISIL) 250 MG tablet Take 250 mg by mouth daily.     Allergies:   Patient has no known allergies.   Social History   Socioeconomic History   Marital status: Married    Spouse name: Not on file   Number of children: Not on file   Years of education: Not on file   Highest education level: Not on file  Occupational History   Not on file  Tobacco Use   Smoking status: Never   Smokeless tobacco: Never  Vaping Use   Vaping Use: Never used  Substance and Sexual Activity   Alcohol use: No   Drug use: No   Sexual activity: Not on file  Other Topics Concern   Not on file  Social History Narrative   Not on file   Social Determinants of Health   Financial Resource Strain: Not on file  Food Insecurity: Not on file  Transportation Needs: Not on file  Physical Activity: Not on file  Stress: Not on file  Social Connections: Not on file     Family History: The patient's family history includes Cancer in his maternal grandmother;  Diabetes in his father; Heart disease in his brother and another family member; Heart disease (age of onset: 81) in his mother; Heart disease (age of onset: 23) in his father; Hyperlipidemia in his brother, father, mother, and another family member; Hypertension in his brother, father, mother, and another family member. There is no history of Colon cancer, Esophageal cancer, Stomach cancer, or Rectal cancer.  ROS:   Please see the history of present illness.     All other systems reviewed and are negative.  EKGs/Labs/Other Studies Reviewed:    The following studies were reviewed today:   EKG: December 23, 2021: Normal sinus rhythm.  No ST or T wave changes.  Recent Labs: 12/23/2021: ALT 22; B Natriuretic Peptide 4.2; BUN 11; Creatinine, Ser 1.39; Hemoglobin 14.6; Platelets 203; Potassium 3.2; Sodium 140  Recent Lipid Panel    Component Value Date/Time   CHOL 128 11/27/2014 1018   TRIG 89 11/27/2014 1018   HDL 39 (L) 11/27/2014 1018   CHOLHDL 3.3 11/27/2014 1018   VLDL 18 11/27/2014 1018   LDLCALC 71 11/27/2014 1018     Risk Assessment/Calculations:                Physical Exam:    VS:  BP 118/82   Pulse 90   Ht '6\' 1"'$  (1.854 m)   Wt 258 lb 9.6 oz (117.3 kg)   SpO2 95%   BMI 34.12 kg/m     Wt Readings from Last 3 Encounters:  12/28/21 258 lb 9.6 oz (117.3 kg)  12/23/21 264 lb (119.7 kg)  05/04/21 257 lb (116.6 kg)     GEN:  Well nourished, well developed in no acute distress HEENT: Normal NECK: No JVD; No carotid bruits LYMPHATICS: No lymphadenopathy CARDIAC: RRR, no murmurs, rubs, gallops RESPIRATORY:  Clear to auscultation without rales, wheezing or rhonchi  ABDOMEN: Soft, non-tender, non-distended MUSCULOSKELETAL:  No edema; No deformity  SKIN: Warm and dry NEUROLOGIC:  Alert and oriented x 3 PSYCHIATRIC:  Normal affect   ASSESSMENT:    1. Elevated coronary artery calcium score   2. Nausea   3. Weakness   4. Unstable angina (HCC)    PLAN:    In  order of  problems listed above:  Unstable angina: Lauris presents with symptoms that are likely anginal equivalent.  He has severe nausea, and feels like he has to vomit.  He has sudden weakness and shortness of breath.  He has been found to have bradycardia and hypotension with these.  While this could be just due to a vagal reaction this also could be due to critical coronary artery disease, especially involving the right coronary artery. His symptoms seem to be progressing fairly quickly. His lipoprotein a is 247 which is extremely high. He has a very strong family history of coronary artery disease and all of the [place] members of his family.  We will schedule him for a heart catheterization.  We have discussed the risk, benefits, options of heart catheterization.  He understands and agrees to proceed.  2.  Hyperlipidemia: His last LDL is fairly well controlled on rosuvastatin.  He is lipoprotein a is 247.  I think that we should refer him to the lipid clinic for consideration of a PCSK9 inhibitor.       Shared Decision Making/Informed Consent{   The risks [stroke (1 in 1000), death (1 in 1000), kidney failure [usually temporary] (1 in 500), bleeding (1 in 200), allergic reaction [possibly serious] (1 in 200)], benefits (diagnostic support and management of coronary artery disease) and alternatives of a cardiac catheterization were discussed in detail with Mr. Kabat and he is willing to proceed.    Medication Adjustments/Labs and Tests Ordered: Current medicines are reviewed at length with the patient today.  Concerns regarding medicines are outlined above.  Orders Placed This Encounter  Procedures   AMB Referral to [facility] Pharm[facility]   Meds ordered this encounter  Medications   isosorbide mononitrate (IMDUR) 30 MG 24 hr tablet    Sig: Take 1 tablet (30 mg total) by mouth daily.    Dispense:  90 tablet    Refill:  3    Patient Instructions  Medication Instructions:  START  Isosorbide Mononitrate '30mg'$  daily *If you need a refill on your cardiac medications before your next appointment, please call your pharmacy*   Lab Work: NONE If you have labs (blood work) drawn today and your tests are completely normal, you will receive your results only by: [place] (if you have MyChart) OR A paper copy in the mail If you have any lab test that is abnormal or we need to change your treatment, we will call you to review the results.   Testing/Procedures: Ambulatory Referral to [place] heart Cath Your physician has requested that you have a cardiac catheterization. Cardiac catheterization is used to diagnose and/or treat various heart conditions. Doctors may recommend this procedure for a number of different reasons. The most common reason is to evaluate chest pain. Chest pain can be a symptom of coronary artery disease (CAD), and cardiac catheterization can show whether plaque is narrowing or blocking your heart's arteries. This procedure is also used to evaluate the valves, as well as measure the blood flow and oxygen levels in different parts of your heart. For further information please visit [email or web address]. Please follow instruction sheet, as given.  Follow-Up: At [facility], you and your health needs are our priority.  As part of our continuing mission to provide you with exceptional heart care, we have created designated Provider Care Teams.  These Care Teams include your primary Cardiologist (physician) and Advanced Practice Providers (APPs -  Physician Assistants and Nurse Practitioners) who all work together to provide you  with the care you need, when you need it.  We recommend signing up for the patient portal called "MyChart".  Sign up information is provided on this After Visit Summary.  MyChart is used to connect with patients for Virtual Visits (Telemedicine).  Patients are able to view lab/test results, encounter notes, upcoming  appointments, etc.  Non-urgent messages can be sent to your provider as well.   To learn more about what you can do with MyChart, go to [email or web address].    Your next appointment:   1 month(s)  The format for your next appointment:   In Person  Provider:   Mertie Moores, MD   Other Instructions  [place] OFFICE [place], [place] [address] Dept: [phone] Loc: [place]  12/28/2021  You are scheduled for a Cardiac Catheterization on Wednesday, August 23 with Dr. Larae Grooms.  1. Please arrive at the Main Entrance A at [facility]: [place], [place] [address] at 12:30 PM (This time is two hours before your procedure to ensure your preparation). Free valet parking service is available.   Special note: Every effort is made to have your procedure done on time. Please understand that emergencies sometimes delay scheduled procedures.  2. Diet: Do not eat solid foods after midnight.  You may have clear liquids until 5 AM upon the day of the procedure.  3. Labs: NONE  4. Medication instructions in preparation for your procedure:   Contrast Allergy: No  On the morning of your procedure, take Aspirin '81mg'$  and any morning medicines NOT listed above.  You may use sips of water.  5. Plan to go home the same day, you will only stay overnight if medically necessary. 6. You MUST have a responsible adult to drive you home. 7. An adult MUST be with you the first 24 hours after you arrive home. 8. Bring a current list of your medications, and the last time and date medication taken. 9. Bring ID and current insurance cards. 10.Please wear clothes that are easy to get on and off and wear slip-on shoes.  Thank you for allowing [place] to care for you!   -- [facility]   Important Information About  Sugar         Signed, Mertie Moores, MD  12/28/2021 6:33 PM    [place]

## 2021-12-29 ENCOUNTER — Encounter (HOSPITAL_COMMUNITY)
Admission: RE | Disposition: A | Discharge status: Home / Self Care | Payer: Self-pay | Source: Home / Self Care | Attending: Interventional Cardiology

## 2021-12-29 ENCOUNTER — Other Ambulatory Visit: Payer: Self-pay

## 2021-12-29 ENCOUNTER — Ambulatory Visit (HOSPITAL_COMMUNITY)
Admission: RE | Admit: 2021-12-29 | Discharge: 2021-12-29 | Disposition: A | Discharge status: Home / Self Care | Payer: BC Managed Care – PPO | Attending: Interventional Cardiology | Admitting: Interventional Cardiology

## 2021-12-29 DIAGNOSIS — Z8249 Family history of ischemic heart disease and other diseases of the circulatory system: Secondary | ICD-10-CM | POA: Diagnosis not present

## 2021-12-29 DIAGNOSIS — I200 Unstable angina: Secondary | ICD-10-CM

## 2021-12-29 DIAGNOSIS — E785 Hyperlipidemia, unspecified: Secondary | ICD-10-CM | POA: Insufficient documentation

## 2021-12-29 DIAGNOSIS — I25110 Atherosclerotic heart disease of native coronary artery with unstable angina pectoris: Secondary | ICD-10-CM | POA: Insufficient documentation

## 2021-12-29 HISTORY — PX: LEFT HEART CATH AND CORONARY ANGIOGRAPHY: CATH118249

## 2021-12-29 LAB — POCT I-STAT, CHEM 8
BUN: 14 mg/dL (ref 6–20)
Calcium, Ion: 1.05 mmol/L — ABNORMAL LOW (ref 1.15–1.40)
Chloride: 105 mmol/L (ref 98–111)
Creatinine, Ser: 1.2 mg/dL (ref 0.61–1.24)
Glucose, Bld: 95 mg/dL (ref 70–99)
HCT: 42 % (ref 39.0–52.0)
Hemoglobin: 14.3 g/dL (ref 13.0–17.0)
Potassium: 4.3 mmol/L (ref 3.5–5.1)
Sodium: 139 mmol/L (ref 135–145)
TCO2: 24 mmol/L (ref 22–32)

## 2021-12-29 SURGERY — LEFT HEART CATH AND CORONARY ANGIOGRAPHY
Anesthesia: LOCAL

## 2021-12-29 MED ORDER — SODIUM CHLORIDE 0.9% FLUSH: 3.0000 mL | INTRAVENOUS | Status: DC | PRN

## 2021-12-29 MED ORDER — LIDOCAINE HCL (PF) 1 % IJ SOLN
INTRAMUSCULAR | Status: DC | PRN
  Administered 2021-12-29: 2 mL via INTRADERMAL

## 2021-12-29 MED ORDER — HEPARIN (PORCINE) IN NACL 1000-0.9 UT/500ML-% IV SOLN
INTRAVENOUS | Status: DC | PRN
  Administered 2021-12-29 (×2): 500 mL

## 2021-12-29 MED ORDER — ACETAMINOPHEN 325 MG PO TABS
650.0000 mg | ORAL_TABLET | ORAL | Status: DC | PRN
  Administered 2021-12-29: 650 mg via ORAL
  Filled 2021-12-29: qty 2

## 2021-12-29 MED ORDER — HEPARIN SODIUM (PORCINE) 1000 UNIT/ML IJ SOLN
INTRAMUSCULAR | Status: DC | PRN
  Administered 2021-12-29: 5500 [IU] via INTRAVENOUS

## 2021-12-29 MED ORDER — ONDANSETRON HCL 4 MG/2ML IJ SOLN: 4.0000 mg | Freq: Four times a day (QID) | INTRAMUSCULAR | Status: DC | PRN

## 2021-12-29 MED ORDER — MIDAZOLAM HCL 2 MG/2ML IJ SOLN
INTRAMUSCULAR | Status: DC | PRN
  Administered 2021-12-29: 1 mg via INTRAVENOUS
  Administered 2021-12-29: 2 mg via INTRAVENOUS

## 2021-12-29 MED ORDER — HEPARIN SODIUM (PORCINE) 1000 UNIT/ML IJ SOLN
INTRAMUSCULAR | Status: AC
  Filled 2021-12-29: qty 10

## 2021-12-29 MED ORDER — LABETALOL HCL 5 MG/ML IV SOLN: 10.0000 mg | INTRAVENOUS | Status: DC | PRN

## 2021-12-29 MED ORDER — SODIUM CHLORIDE 0.9% FLUSH: 3.0000 mL | Freq: Two times a day (BID) | INTRAVENOUS | Status: DC

## 2021-12-29 MED ORDER — FENTANYL CITRATE (PF) 100 MCG/2ML IJ SOLN
INTRAMUSCULAR | Status: DC | PRN
Start: 2021-12-29 — End: 2021-12-29
  Administered 2021-12-29 (×2): 25 ug via INTRAVENOUS

## 2021-12-29 MED ORDER — SODIUM CHLORIDE 0.9 % WEIGHT BASED INFUSION: 1.0000 mL/kg/h | INTRAVENOUS | Status: DC

## 2021-12-29 MED ORDER — FENTANYL CITRATE (PF) 100 MCG/2ML IJ SOLN
INTRAMUSCULAR | Status: AC
  Filled 2021-12-29: qty 2

## 2021-12-29 MED ORDER — MIDAZOLAM HCL 2 MG/2ML IJ SOLN
INTRAMUSCULAR | Status: AC
  Filled 2021-12-29: qty 2

## 2021-12-29 MED ORDER — SODIUM CHLORIDE 0.9 % WEIGHT BASED INFUSION
3.0000 mL/kg/h | INTRAVENOUS | Status: AC
  Administered 2021-12-29: 3 mL/kg/h via INTRAVENOUS

## 2021-12-29 MED ORDER — HYDRALAZINE HCL 20 MG/ML IJ SOLN: 10.0000 mg | INTRAMUSCULAR | Status: DC | PRN

## 2021-12-29 MED ORDER — SODIUM CHLORIDE 0.9 % IV SOLN: INTRAVENOUS | Status: AC

## 2021-12-29 MED ORDER — SODIUM CHLORIDE 0.9 % IV SOLN: 250.0000 mL | INTRAVENOUS | Status: DC | PRN

## 2021-12-29 MED ORDER — LIDOCAINE HCL (PF) 1 % IJ SOLN
INTRAMUSCULAR | Status: AC
  Filled 2021-12-29: qty 30

## 2021-12-29 MED ORDER — HEPARIN (PORCINE) IN NACL 1000-0.9 UT/500ML-% IV SOLN
INTRAVENOUS | Status: AC
  Filled 2021-12-29: qty 500

## 2021-12-29 MED ORDER — IOHEXOL 350 MG/ML SOLN
INTRAVENOUS | Status: DC | PRN
  Administered 2021-12-29: 45 mL via INTRA_ARTERIAL

## 2021-12-29 MED ORDER — VERAPAMIL HCL 2.5 MG/ML IV SOLN
INTRAVENOUS | Status: DC | PRN
  Administered 2021-12-29 (×2): 10 mL via INTRA_ARTERIAL

## 2021-12-29 MED ORDER — ASPIRIN 81 MG PO CHEW: 81.0000 mg | CHEWABLE_TABLET | ORAL | Status: DC

## 2021-12-29 MED ORDER — VERAPAMIL HCL 2.5 MG/ML IV SOLN
INTRAVENOUS | Status: AC
  Filled 2021-12-29: qty 2

## 2021-12-29 SURGICAL SUPPLY — 11 items
BAND CMPR LRG ZPHR (HEMOSTASIS) ×1
BAND ZEPHYR COMPRESS 30 LONG (HEMOSTASIS) IMPLANT
CATH INFINITI 5 FR JL3.5 (CATHETERS) IMPLANT
CATH INFINITI JR4 5F (CATHETERS) IMPLANT
GLIDESHEATH SLEND SS 6F .021 (SHEATH) IMPLANT
GUIDEWIRE INQWIRE 1.5J.035X260 (WIRE) IMPLANT
INQWIRE 1.5J .035X260CM (WIRE) ×1
KIT HEART LEFT (KITS) ×2 IMPLANT
PACK CARDIAC CATHETERIZATION (CUSTOM PROCEDURE TRAY) ×2 IMPLANT
TRANSDUCER W/STOPCOCK (MISCELLANEOUS) ×2 IMPLANT
TUBING CIL FLEX 10 FLL-RA (TUBING) ×2 IMPLANT

## 2021-12-29 NOTE — Interval H&P Note (Signed)
Cath Lab Visit (complete for each Cath Lab visit)  Clinical Evaluation Leading to the Procedure:   ACS: No.  Non-ACS:    Anginal Classification: CCS III  Anti-ischemic medical therapy: Minimal Therapy (1 class of medications)  Non-Invasive Test Results: Intermediate-risk stress test findings: cardiac mortality 1-3%/year  Prior CABG: No previous CABG      History and Physical Interval Note:  12/29/2021 1:44 PM  Kristopher Mendez  has presented today for surgery, with the diagnosis of unstable angina.  The various methods of treatment have been discussed with the patient and family. After consideration of risks, benefits and other options for treatment, the patient has consented to  Procedure(s): LEFT HEART CATH AND CORONARY ANGIOGRAPHY (N/A) as a surgical intervention.  The patient's history has been reviewed, patient examined, no change in status, stable for surgery.  I have reviewed the patient's chart and labs.  Questions were answered to the patient's satisfaction.     Larae Grooms

## 2021-12-30 ENCOUNTER — Encounter (HOSPITAL_COMMUNITY): Payer: Self-pay | Admitting: Interventional Cardiology

## 2022-01-19 ENCOUNTER — Encounter: Payer: Self-pay | Admitting: Cardiovascular Disease

## 2022-01-19 NOTE — Progress Notes (Signed)
Cardiology Office Note:    Date:  01/20/2022   ID:  Kristopher Mendez, DOB 1970-06-01, MRN [id number]  PCP:  [name], NP   [place] Providers Cardiologist:  Phoenix Riesen    Referring MD: [name], NP   Chief Complaint  Patient presents with   coronary calcification    History of Present Illness:    Kristopher Mendez is a 50 y.o. male with a hx of hyperlipidemia.  He has a family history of coronary artery disease.  Seen with Marcelino Scot   He had an episode of syncope and presented to the ER on August 17. He had a cardiac CT scan that day.  He had onset of nausea following the CT scan.  He also had diarrhea.  He went to the bathroom and at some point passed out.  He woke up and noticed that he was at some chest pain and shortness of breath. Coronary artery calcium score was performed at a [facility] facility.  This CAC was 957.  - Total Calcium Score: 957.  -- Left Main: 8.  -- Left Anterior Descending: 408.  -- Circumflex: 0.  -- Right Coronary: 541  -- Posterior Descending Artery: 0     Woke up from the cough,    Thought he needed to have bowel movement , then thought he was going to vomit. 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    Sept. 14, 2023 Kristopher Mendez is seen following his cardiac cath. Cath revealed mild mid LAD stenosis Normal LV systolic function  Has been feeling better Energy is getting better  Pain is worse with lying down  He is on protonic and pepcid Going to see GI in Oct.        Lipoprotein (A) <75.0  nmol/L 247.8 High     Cholesterol, Total 100 - 199 mg/dL 150    Triglycerides 0 - 149 mg/dL 71    HDL >39 mg/dL 40    VLDL Cholesterol Cal 5 - 40 mg/dL 14    LDL 0 - 99 mg/dL 96    LDL/HDL Ratio 0.0 - 3.6 ratio 2.4      Grandfather died of cardiac arrest at 25    Past Medical History:  Diagnosis Date   Allergy    COVID-19    Dysplastic nevus    left shoulder   Family history of heart disease in male family member before age 40    Hemorrhoid 05/09/2013   Hyperlipidemia    Palpitations    Restless leg syndrome    Vitamin D deficiency     Past Surgical History:  Procedure Laterality Date   HEMORROIDECTOMY  05/09/2013   LEFT HEART CATH AND CORONARY ANGIOGRAPHY N/A 12/29/2021   Procedure: LEFT HEART CATH AND CORONARY ANGIOGRAPHY;  Surgeon: [name], MD;  Location: [facility];  Service: Cardiovascular;  Laterality: N/A;   SHOULDER SURGERY Right  spurs   SKIN CANCER EXCISION Left 12/07/2012   SHOULDER   THROAT SURGERY      Current Medications: No outpatient medications have been marked as taking for the 01/20/22 encounter (Office Visit) with Merve Hotard, Wonda Cheng, MD.     Allergies:   Patient has no known allergies.   Social History   Socioeconomic History   Marital status: Married    Spouse name: Not on file   Number of children: Not on file   Years of education: Not on file   Highest education level: Not on file  Occupational History   Not on file  Tobacco Use   Smoking status: Never   Smokeless tobacco: Never  Vaping Use   Vaping Use: Never used  Substance and Sexual Activity   Alcohol use: No   Drug use: No   Sexual activity: Not on file  Other Topics Concern   Not on file  Social History Narrative   Not on file   Social Determinants of Health   Financial Resource Strain: Not on file  Food Insecurity: Not on file  Transportation Needs: Not on file  Physical Activity: Not on file  Stress: Not on file  Social Connections: Not on  file     Family History: The patient's family history includes Cancer in his maternal grandmother; Diabetes in his father; Heart disease in his brother and another family member; Heart disease (age of onset: 2) in his mother; Heart disease (age of onset: 29) in his father; Hyperlipidemia in his brother, father, mother, and another family member; Hypertension in his brother, father, mother, and another family member. There is no history of Colon cancer, Esophageal cancer, Stomach cancer, or Rectal cancer.  ROS:   Please see the history of present illness.     All other systems reviewed and are negative.  EKGs/Labs/Other Studies Reviewed:    The following studies were reviewed today:   EKG:   Recent Labs: 12/23/2021: ALT 22; B Natriuretic Peptide 4.2; Platelets 203 12/29/2021: BUN 14; Creatinine, Ser 1.20; Hemoglobin 14.3; Potassium 4.3; Sodium 139  Recent Lipid Panel    Component Value Date/Time   CHOL 128 11/27/2014 1018   TRIG 89 11/27/2014 1018   HDL 39 (L) 11/27/2014 1018   CHOLHDL 3.3 11/27/2014 1018   VLDL 18 11/27/2014 1018   LDLCALC 71 11/27/2014 1018     Risk Assessment/Calculations:                Physical Exam:    Physical Exam: Blood pressure 112/70, pulse 80, height '6\' 1"'$  (1.854 m), weight 254 lb 12.8 oz (115.6 kg), SpO2 98 %.       GEN:  Well nourished, well developed in no acute distress HEENT: Normal NECK: No JVD; No carotid bruits LYMPHATICS: No lymphadenopathy CARDIAC: RRR , no murmurs, rubs, gallops RESPIRATORY:  Clear to auscultation without rales, wheezing or rhonchi  ABDOMEN: Soft, non-tender, non-distended MUSCULOSKELETAL:  No edema; No deformity  SKIN: Warm and dry NEUROLOGIC:  Alert and oriented x 3   ASSESSMENT:    1. Mixed hyperlipidemia   2. Elevated Lp(a)   3. Chest pain of uncertain etiology     PLAN:     Chest pain: Kristopher Mendez presents for further evaluation of his chest pain.  Heart catheterization revealed only mild LAD  stenosis.  I suspect that his chest discomfort is due to noncardiac etiology.  2.  Hyperlipidemia:   He has an extremely elevated lipoprotein a. Lp(a) is 248 Will see our  lipid clinic .    3.  Elevated Lp(a)   - will have him see the lipid clinic for consideration for a PCSK9 inhibitor      Shared Decision Making/Informed Consent{       Medication Adjustments/Labs and Tests Ordered: Current medicines are reviewed at length with the patient today.  Concerns regarding medicines are outlined above.  No orders of the defined types were placed in this encounter.  No orders of the defined types were placed in this encounter.   Patient Instructions  Medication Instructions:  Your physician recommends that you continue on your current medications as directed. Please refer to the Current Medication list given to you today.  *If you need a refill on your cardiac medications before your next appointment, please call your pharmacy*   Lab Work: NONE If you have labs (blood work) drawn today and your tests are completely normal, you will receive your results only by: [place] (if you have MyChart) OR A paper copy in the mail If you have any lab test that is abnormal or we need to change your treatment, we will call you to review the results.   Testing/Procedures: NONE   Follow-Up: At [facility], you and your health needs are our priority.  As part of our continuing mission to provide you with exceptional heart care, we have created designated Provider Care Teams.  These Care Teams include your primary Cardiologist (physician) and Advanced Practice Providers (APPs -  Physician Assistants and Nurse Practitioners) who all work together to provide you with the care you need, when you need it.  We recommend signing up for the patient portal called "MyChart".  Sign up information is provided on this After Visit Summary.  MyChart is used to connect with patients for Virtual  Visits (Telemedicine).  Patients are able to view lab/test results, encounter notes, upcoming appointments, etc.  Non-urgent messages can be sent to your provider as well.   To learn more about what you can do with MyChart, go to [email or web address].    Your next appointment:   01/27/22 @ 3:30 w/ Lipid clinic  Then Dr Acie Fredrickson will plan to see you in 1 year     Important Information About [place]         Signed, Mertie Moores, MD  01/20/2022 8:19 PM    Kristopher Mendez

## 2022-01-20 ENCOUNTER — Encounter: Payer: Self-pay | Admitting: Cardiovascular Disease

## 2022-01-20 ENCOUNTER — Ambulatory Visit: Payer: BC Managed Care – PPO | Attending: Cardiovascular Disease | Admitting: Cardiovascular Disease

## 2022-01-20 VITALS — BP 112/70 | HR 80 | Ht 73.0 in | Wt 254.8 lb

## 2022-01-20 DIAGNOSIS — E7841 Elevated Lipoprotein(a): Secondary | ICD-10-CM | POA: Insufficient documentation

## 2022-01-20 DIAGNOSIS — R079 Chest pain, unspecified: Secondary | ICD-10-CM | POA: Insufficient documentation

## 2022-01-20 DIAGNOSIS — E782 Mixed hyperlipidemia: Secondary | ICD-10-CM

## 2022-01-20 NOTE — Patient Instructions (Signed)
Medication Instructions:  Your physician recommends that you continue on your current medications as directed. Please refer to the Current Medication list given to you today.  *If you need a refill on your cardiac medications before your next appointment, please call your pharmacy*   Lab Work: NONE If you have labs (blood work) drawn today and your tests are completely normal, you will receive your results only by: [place] (if you have MyChart) OR A paper copy in the mail If you have any lab test that is abnormal or we need to change your treatment, we will call you to review the results.   Testing/Procedures: NONE   Follow-Up: At [facility], you and your health needs are our priority.  As part of our continuing mission to provide you with exceptional heart care, we have created designated Provider Care Teams.  These Care Teams include your primary Cardiologist (physician) and Advanced Practice Providers (APPs -  Physician Assistants and Nurse Practitioners) who all work together to provide you with the care you need, when you need it.  We recommend signing up for the patient portal called "MyChart".  Sign up information is provided on this After Visit Summary.  MyChart is used to connect with patients for Virtual Visits (Telemedicine).  Patients are able to view lab/test results, encounter notes, upcoming appointments, etc.  Non-urgent messages can be sent to your provider as well.   To learn more about what you can do with MyChart, go to [email or web address].    Your next appointment:   01/27/22 @ 3:30 w/ Lipid clinic  Then Dr Acie Fredrickson will plan to see you in 1 year     Important Information About [place]

## 2022-01-27 ENCOUNTER — Telehealth: Payer: Self-pay | Admitting: Pharmacist

## 2022-01-27 ENCOUNTER — Ambulatory Visit: Payer: BC Managed Care – PPO | Attending: Cardiovascular Disease | Admitting: Pharmacist

## 2022-01-27 DIAGNOSIS — E782 Mixed hyperlipidemia: Secondary | ICD-10-CM

## 2022-01-27 MED ORDER — ROSUVASTATIN CALCIUM 20 MG PO TABS: 20.0000 mg | ORAL_TABLET | Freq: Every day | ORAL | 3 refills | Status: DC

## 2022-01-27 MED ORDER — REPATHA SURECLICK 140 MG/ML ~~LOC~~ SOAJ: 1.0000 | SUBCUTANEOUS | 11 refills | Status: DC

## 2022-01-27 NOTE — Patient Instructions (Addendum)
Start taking rosuvastatin '20mg'$  daily I will let you know what the insurance says about [place] Please call me at [phone] with any questions

## 2022-01-27 NOTE — Progress Notes (Signed)
Patient ID: Kristopher Mendez                 DOB: [birth year]                    MRN: [id number]      HPI: Kristopher Mendez is a 51 y.o. male patient referred to lipid clinic by Dr. Acie Fredrickson. PMH is significant for HLD, angina, CAC 957 s/p cardiac cath which showed mid LAD 25% stenosed. Strong family history of CAD. LPa 247.   Patient presents today to lipid clinic. I know him from when we use to do CrossFit together. He had a lot going on this summer with health and his good friend passed away. 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 Leaves the house at 7 AM and doesn't get home until 8-9 PM. He is taking a break from crossfit due to time constraints. Plans to retire next year. Exercise currently is walking/playing with the dog. Has been working on his diet. Lost 10lb in the last month.  Current Medications: rosuvastatin '5mg'$  daily Intolerances:  Risk Factors: elevated LPa, CAD, family hx LDL goal: <55  Diet: mediterranean diet- lots a vegetables, fish, chicken, hummus, no sweets  Exercise: walks dog  Family History: Father had 2 MIs in 36s , 37 s Uncle had MI in his 14s Multiple male members have had premature CAD   Social History: no tobacco, no alcohol  Labs:  Lipoprotein (A) <75.0 nmol/L 247.8 High  (12/20/21)   12/31/20 Cholesterol, Total 100 - 199 mg/dL 150     Triglycerides 0 - 149 mg/dL 71     HDL >39 mg/dL 40     VLDL Cholesterol Cal 5 - 40 mg/dL 14     LDL 0 - 99 mg/dL 96     LDL/HDL Ratio 0.0 - 3.6 ratio 2.4      Past Medical History:  Diagnosis Date   Allergy    COVID-19    Dysplastic nevus    left shoulder   Family history of heart disease in male family member before age 46    Hemorrhoid 05/09/2013   Hyperlipidemia    Palpitations    Restless leg syndrome    Vitamin D deficiency     Current Outpatient Medications on File Prior to Visit  Medication Sig Dispense Refill   aspirin EC 81 MG tablet Take 81 mg by mouth daily. Swallow whole.      famotidine (PEPCID) 40 MG tablet Take 40 mg by mouth daily.     isosorbide mononitrate (IMDUR) 30 MG 24 hr tablet Take 1 tablet (30 mg total) by mouth daily. 90 tablet 3   ondansetron (ZOFRAN-ODT) 4 MG disintegrating tablet Take by mouth.     pantoprazole (PROTONIX) 20 MG tablet Take 20 mg by mouth daily.     rOPINIRole (REQUIP) 2 MG tablet Take 2 mg by mouth daily.     rosuvastatin (CRESTOR) 5 MG tablet Take 5 mg by mouth daily.     terbinafine (LAMISIL) 250 MG tablet Take 250 mg by mouth daily.     No current facility-administered medications on file prior to visit.    No Known Allergies  Assessment/Plan:  1. Hyperlipidemia - LDL-C is above goal of <55. Will increase rosuvastatin to '20mg'$  daily. Patient is at high risk due to family hx of CAD and his elevated LPa. Only treatment option for LPa currently is PCSK9i. Does not qualify for Ocean (a) study. Will  see if we can get PCSK9i approved through insurance. Repeat labs in 2 months.   Thank you,   Ramond Dial, Pharm.D, BCPS, CPP [facility] A Division of [facility] [address] [place], [place] [address]  Phone: [phone]; Fax: [phone]

## 2022-01-28 NOTE — Telephone Encounter (Signed)
PA for Repatha approved through 01/28/23

## 2022-02-01 NOTE — Telephone Encounter (Signed)
Patient made aware of the approval and rx sent. Advised he can get copay card from [email or web address] card

## 2022-02-14 ENCOUNTER — Encounter: Payer: Self-pay | Admitting: Gastroenterology

## 2022-02-14 ENCOUNTER — Ambulatory Visit (INDEPENDENT_AMBULATORY_CARE_PROVIDER_SITE_OTHER): Payer: BC Managed Care – PPO | Admitting: Gastroenterology

## 2022-02-14 VITALS — BP 120/82 | HR 81 | Ht 73.0 in | Wt 256.0 lb

## 2022-02-14 DIAGNOSIS — R079 Chest pain, unspecified: Secondary | ICD-10-CM | POA: Diagnosis not present

## 2022-02-14 NOTE — Patient Instructions (Addendum)
If you are age 49 or younger, your body mass index should be between 19-25. Your Body mass index is 33.78 kg/m. If this is out of the aformentioned range listed, please consider follow up with your Primary Care Provider.  ________________________________________________________  The [facility] providers would like to encourage you to use [facility] to communicate with providers for non-urgent requests or questions.  Due to long hold times on the telephone, sending your provider a message by [facility] may be a faster and more efficient way to get a response.  Please allow 48 business hours for a response.  Please remember that this is for non-urgent requests.  _______________________________________________________  It has been recommended to you by your physician that you have an endoscopy completed. Per your request, we did not schedule the procedure(s) today. Please contact our office at [phone] should you decide to have the procedure completed. You will be scheduled for a pre-visit and procedure at that time.  Thank you for entrusting me with your care and choosing [facility].  Dr Candis Schatz

## 2022-02-14 NOTE — Progress Notes (Signed)
HPI : Kristopher Mendez is a very pleasant 51 year old male with a history of hyperlipidemia who is referred to [place] by Adriana Reams NP for further evaluation of chest pain.  Patient describes episodes of chest pain going back few years which have been associated with syncopal events.  First episode occurred in 2019 while on the bus ride to [place].  The second episode occurred while on a plane to [place] in 2021.  A third episode occurred again on the plane coming back from [place].  He had his most significant episode in August of this year.  Each of these episodes has been associated with him awakening from sleep with severe chest pain (lower sternum) along with severe nausea and diaphoresis.  His symptoms at the urge to have bowel movement during these episodes.  He has vomited and reddish many times.  On several episodes he has passed out completely, including on a plane.  During the most recent episode in August, EMS were called and transported him to the emergency department.  It was felt that he may have unstable angina and he underwent an emergent cardiac catheterization which showed nonobstructive coronary artery disease.  His chest pain was not felt to be cardiac in etiology.  No further cardiac work-up was recommended. He was also having some problems with excessive belching in the past few months.  Belching is not typically associated with these episodes of chest pain or syncope, but was noted to be more of a problem in the months following his chest pain episode in August.  He was started on Protonix and Pepcid, and the belching seems to have improved.  He denies typical GERD symptoms such as heartburn or acid regurgitation.  No difficulty with food getting stuck when he swallows.  He denies problems with nausea or vomiting outside of these isolated episodes.  His weight is stable.   Past Medical History:  Diagnosis Date   Allergy    COVID-19    Dysplastic nevus    left shoulder   Family  history of heart disease in male family member before age 38    Hemorrhoid 05/09/2013   Hyperlipidemia    Palpitations    Restless leg syndrome    Vitamin D deficiency    Colonoscopy 12/02/2020: 2 polyps, 12 and 6 mm (sessile serrated polyps): Repeat colonoscopy 3 years  Past Surgical History:  Procedure Laterality Date   HEMORROIDECTOMY  05/09/2013   LEFT HEART CATH AND CORONARY ANGIOGRAPHY N/A 12/29/2021   Procedure: LEFT HEART CATH AND CORONARY ANGIOGRAPHY;  Surgeon: [name], MD;  Location: [place] [facility];  Service: Cardiovascular;  Laterality: N/A;   SHOULDER SURGERY Right    spurs   SKIN CANCER EXCISION Left 12/07/2012   SHOULDER   THROAT SURGERY     Family History  Problem Relation Age of Onset   Hyperlipidemia Mother    Heart disease Mother 57   Hypertension Mother    Diabetes Father    Hyperlipidemia Father    Heart disease Father 8   Hypertension Father    Heart disease Brother    Hyperlipidemia Brother    Hypertension Brother    Cancer Maternal Grandmother        lymphoma   Hypertension Other    Heart disease Other    Hyperlipidemia Other    Colon cancer Neg Hx    Esophageal cancer Neg Hx    Stomach cancer Neg Hx    Rectal cancer Neg Hx  Social History   Tobacco Use   Smoking status: Never   Smokeless tobacco: Never  Vaping Use   Vaping Use: Never used  Substance Use Topics   Alcohol use: No   Drug use: No   Current Outpatient Medications  Medication Sig Dispense Refill   Evolocumab (REPATHA SURECLICK) 106 MG/ML SOAJ Inject 1 Pen into the skin every 14 (fourteen) days. 2 mL 11   famotidine (PEPCID) 40 MG tablet Take 40 mg by mouth daily.     isosorbide mononitrate (IMDUR) 30 MG 24 hr tablet Take 1 tablet (30 mg total) by mouth daily. 90 tablet 3   pantoprazole (PROTONIX) 20 MG tablet Take 20 mg by mouth daily.     rOPINIRole (REQUIP) 2 MG tablet Take 2 mg by mouth daily.     rosuvastatin (CRESTOR) 20 MG tablet Take 1 tablet (20  mg total) by mouth daily. 90 tablet 3   terbinafine (LAMISIL) 250 MG tablet Take 250 mg by mouth daily.     No current facility-administered medications for this visit.   No Known Allergies   Review of Systems: All systems reviewed and negative except where noted in HPI.    No results found.  Physical Exam: BP 120/82   Pulse 81   Ht '6\' 1"'$  (1.854 m)   Wt 256 lb (116.1 kg)   BMI 33.78 kg/m  Constitutional: Pleasant,well-developed, Caucasian male in no acute distress.  His husband joined [place] via telephone during the encounter HEENT: Normocephalic and atraumatic. Conjunctivae are normal. No scleral icterus. Neck supple.  Cardiovascular: Normal rate, regular rhythm.  Pulmonary/chest: Effort normal and breath sounds normal. No wheezing, rales or rhonchi. Abdominal: Soft, nondistended, nontender. Bowel sounds active throughout. There are no masses palpable. No hepatomegaly. Extremities: no edema Lymphadenopathy: No cervical adenopathy noted. Neurological: Alert and oriented to person place and time. Skin: Skin is warm and dry. No rashes noted. Psychiatric: Normal mood and affect. Behavior is normal.  CBC    Component Value Date/Time   WBC 12.2 (H) 12/23/2021 2310   RBC 4.53 12/23/2021 2310   HGB 14.3 12/29/2021 1328   HCT 42.0 12/29/2021 1328   PLT 203 12/23/2021 2310   MCV 92.7 12/23/2021 2310   MCH 32.2 12/23/2021 2310   MCHC 34.8 12/23/2021 2310   RDW 12.0 12/23/2021 2310   LYMPHSABS 4.4 (H) 12/23/2021 2310   MONOABS 0.8 12/23/2021 2310   EOSABS 0.0 12/23/2021 2310   BASOSABS 0.1 12/23/2021 2310    CMP     Component Value Date/Time   NA 139 12/29/2021 1328   K 4.3 12/29/2021 1328   CL 105 12/29/2021 1328   CO2 19 (L) 12/23/2021 2310   GLUCOSE 95 12/29/2021 1328   BUN 14 12/29/2021 1328   CREATININE 1.20 12/29/2021 1328   CREATININE 1.02 11/27/2014 1018   CALCIUM 8.4 (L) 12/23/2021 2310   PROT 6.1 (L) 12/23/2021 2310   ALBUMIN 3.4 (L) 12/23/2021 2310   AST  20 12/23/2021 2310   ALT 22 12/23/2021 2310   ALKPHOS 89 12/23/2021 2310   BILITOT 0.8 12/23/2021 2310   GFRNONAA >60 12/23/2021 2310   GFRNONAA >89 11/27/2014 1018   GFRAA >60 08/11/2016 1805   GFRAA >89 11/27/2014 1018     ASSESSMENT AND PLAN: 51 year old male with infrequent episodes of severe chest pain associated with diaphoresis and without like vasovagal syncope.  Most of these episodes started while the patient was asleep and/or traveling.  He does not have typical GERD symptoms.  He was  having a lot of belching recently, but this is resolved, and is typically not a problem for him.  Although it is possible reflux while asleep can precipitate episodes of nausea and vomiting, be very rare for it to trigger vasovagal syncope.  Also, would be very rare for patient to have reflux that severe only on a few occasions, without other symptoms on a more frequent basis.  Esophageal spasm could also be a source of atypical chest pain, but we discussed how difficult it is to definitively diagnose, especially when symptoms are so infrequent.  Also, we discussed how there are no proven therapies for spasm.  I did suggest he keep Altoid mints on hand, as this may improve symptoms from esophageal spasm.  He has never had an EGD.  I think an EGD would be reasonable to assess for evidence of reflux, and assess his anatomy to further gauge the potential for reflux for his symptoms.  I also suggested that if he does not feel that his acid suppressive therapy is necessary, to try to stop his Protonix, as this will give [place] a more accurate picture of the role of acid reflux and his symptoms  Chest pain, noncardiac - EGD - Stop Protonix   The details, risks (including bleeding, perforation, infection, missed lesions, medication reactions and possible hospitalization or surgery if complications occur), benefits, and alternatives to EGD with possible biopsy and possible dilation were discussed with the patient and  he consents to proceed.   Yordy Matton E. Candis Schatz, MD [facility]   CC:  [name], NP

## 2022-03-30 ENCOUNTER — Ambulatory Visit: Payer: BC Managed Care – PPO | Attending: Cardiovascular Disease

## 2022-03-30 DIAGNOSIS — E782 Mixed hyperlipidemia: Secondary | ICD-10-CM

## 2022-03-31 LAB — APOLIPOPROTEIN B: Apolipoprotein B: 58 mg/dL (ref <90)

## 2022-03-31 LAB — HEPATIC FUNCTION PANEL
ALT: 16 IU/L (ref 0–44)
AST: 16 IU/L (ref 0–40)
Albumin: 4 g/dL — ABNORMAL LOW (ref 4.1–5.1)
Alkaline Phosphatase: 142 IU/L — ABNORMAL HIGH (ref 44–121)
Bilirubin Total: 0.3 mg/dL (ref 0.0–1.2)
Bilirubin, Direct: <0.1 mg/dL (ref 0.00–0.40)
Total Protein: 6.3 g/dL (ref 6.0–8.5)

## 2022-03-31 LAB — LIPID PANEL
Chol/HDL Ratio: 3 ratio (ref 0.0–5.0)
Cholesterol, Total: 123 mg/dL (ref 100–199)
HDL: 41 mg/dL (ref >39)
LDL Chol Calc (NIH): 50 mg/dL (ref 0–99)
Triglycerides: 193 mg/dL — ABNORMAL HIGH (ref 0–149)
VLDL Cholesterol Cal: 32 mg/dL (ref 5–40)

## 2022-03-31 LAB — LIPOPROTEIN A (LPA): Lipoprotein (a): 205.2 nmol/L — ABNORMAL HIGH (ref <75.0)

## 2022-04-04 ENCOUNTER — Telehealth: Payer: Self-pay | Admitting: Pharmacist

## 2022-04-04 NOTE — Telephone Encounter (Signed)
LDL-C <55, ApoB good at <60. TG a little high. LPa elevated but down from 247. On rosuvastatin '20mg'$  daily and Repatha.  Spoke with patient. He has been taking rosuvastatin '20mg'$  every other day.  TG were elevated but he states he doesn't think he was fasting.  Continue rosuvastatin '20mg'$  every other day and Repatha '140mg'$  q 14 days. Encouraged resumption of daily exercise.

## 2022-06-14 ENCOUNTER — Encounter: Payer: Self-pay | Admitting: Pharmacist

## 2022-07-17 ENCOUNTER — Encounter: Payer: Self-pay | Admitting: Pharmacist

## 2022-10-29 ENCOUNTER — Other Ambulatory Visit: Payer: Self-pay | Admitting: Cardiovascular Disease

## 2022-10-29 DIAGNOSIS — R931 Abnormal findings on diagnostic imaging of heart and coronary circulation: Secondary | ICD-10-CM

## 2022-10-29 DIAGNOSIS — R531 Weakness: Secondary | ICD-10-CM

## 2022-10-29 DIAGNOSIS — R110 Nausea: Secondary | ICD-10-CM

## 2022-10-29 DIAGNOSIS — I200 Unstable angina: Secondary | ICD-10-CM

## 2022-11-07 ENCOUNTER — Other Ambulatory Visit: Payer: Self-pay | Admitting: Cardiovascular Disease

## 2023-01-31 ENCOUNTER — Telehealth: Payer: Self-pay | Admitting: Pharmacy Technician

## 2023-01-31 ENCOUNTER — Other Ambulatory Visit (HOSPITAL_COMMUNITY): Payer: Self-pay

## 2023-01-31 NOTE — Telephone Encounter (Signed)
Pharmacy Patient Advocate Encounter  Received notification from [facility] that Prior Authorization for repatha has been APPROVED from 01/31/23 to 01/30/24   PA #/Case ID/Reference #: [id number]

## 2023-01-31 NOTE — Telephone Encounter (Signed)
Pharmacy Patient Advocate Encounter   Received notification from CoverMyMeds that prior authorization for repatha is required/requested.   Insurance verification completed.   The patient is insured through [employer] .   Per test claim: PA required; PA submitted to [facility] via CoverMyMeds Key/confirmation #/EOC [facility] Status is pending

## 2023-03-19 ENCOUNTER — Other Ambulatory Visit: Payer: Self-pay | Admitting: Cardiovascular Disease

## 2023-03-19 DIAGNOSIS — R531 Weakness: Secondary | ICD-10-CM

## 2023-03-19 DIAGNOSIS — R931 Abnormal findings on diagnostic imaging of heart and coronary circulation: Secondary | ICD-10-CM

## 2023-03-19 DIAGNOSIS — R110 Nausea: Secondary | ICD-10-CM

## 2023-03-19 DIAGNOSIS — I200 Unstable angina: Secondary | ICD-10-CM

## 2023-04-17 ENCOUNTER — Other Ambulatory Visit: Payer: Self-pay | Admitting: Cardiovascular Disease

## 2023-04-17 DIAGNOSIS — R531 Weakness: Secondary | ICD-10-CM

## 2023-04-17 DIAGNOSIS — R931 Abnormal findings on diagnostic imaging of heart and coronary circulation: Secondary | ICD-10-CM

## 2023-04-17 DIAGNOSIS — I200 Unstable angina: Secondary | ICD-10-CM

## 2023-04-17 DIAGNOSIS — R110 Nausea: Secondary | ICD-10-CM

## 2023-05-20 ENCOUNTER — Other Ambulatory Visit: Payer: Self-pay | Admitting: Cardiovascular Disease

## 2023-05-20 DIAGNOSIS — I200 Unstable angina: Secondary | ICD-10-CM

## 2023-05-20 DIAGNOSIS — R931 Abnormal findings on diagnostic imaging of heart and coronary circulation: Secondary | ICD-10-CM

## 2023-05-20 DIAGNOSIS — R110 Nausea: Secondary | ICD-10-CM

## 2023-05-20 DIAGNOSIS — R531 Weakness: Secondary | ICD-10-CM

## 2023-08-07 ENCOUNTER — Encounter: Payer: Self-pay | Admitting: Cardiovascular Disease

## 2023-08-07 NOTE — Progress Notes (Unsigned)
 Cardiology Office Note:    Date:  08/08/2023   ID:  Kristopher Mendez, DOB 05/19/1970, MRN [id number]  PCP:  April Manson, NP   [facility] Providers Cardiologist:  Danny Zimny    Referring MD: April Manson, NP   Chief Complaint  Patient presents with   coronary artery calcification   Hyperlipidemia    History of Present Illness:    Kristopher Mendez is a 53 y.o. male with a hx of hyperlipidemia.  He has a family history of coronary artery disease.  Seen with Kristopher Mendez   He had an episode of syncope and presented to the ER on August 17. He had a cardiac CT scan that day.  He had onset of nausea following the CT scan.  He also had diarrhea.  He went to the bathroom and at some point passed out.  He woke up and noticed that he was at some chest pain and shortness of breath. Coronary artery calcium score was performed at a [facility] facility.  This CAC was 957.  - Total Calcium Score: 957.  -- Left Main: 8.  -- Left Anterior Descending: 408.  -- Circumflex: 0.  -- Right Coronary: 541  -- Posterior Descending Artery: 0     Woke up from the cough,    Thought he needed to have bowel movement , then thought he was going to vomit. Then sat back against the wall, Went to the ER   This is the 5th time he has had a similar episode   Is a Programmer, systems  Has chronic fatigue,  Tires easily  Used to [employer],  now does not do much exercise   Has more DOE now   + fam hx of CAd Father had 2 MIs in 56s , 74 s Uncle had MI in his 26s Multiple male members have had premature CAD   Labs at [facility]    Hx symptoms seem to be progressing  Having more and more nausea, weakness, dyspnea.    Sept. 14, 2023 Kristopher Mendez is seen following his cardiac cath. Cath revealed mild mid LAD stenosis Normal LV systolic function  Has been feeling better Energy is getting better  Pain is worse with lying down  He is on protonic and pepcid Going to see GI in Oct.    Lipoprotein (A) <75.0 nmol/L 247.8 High     Cholesterol, Total 100 - 199 mg/dL 782    Triglycerides 0 - 149 mg/dL 71    HDL >95 mg/dL 40    VLDL Cholesterol Cal 5 - 40 mg/dL 14    LDL 0 - 99 mg/dL 96    LDL/HDL Ratio 0.0 - 3.6 ratio 2.4      Grandfather died of cardiac arrest at 35    August 08, 2023 Kristopher Mendez is seen for follow up of his coronary artery calcifications Is planning on retiring soon,  - Is a teacher  Staying active  Is on Repatha  His last LDL recorded in chart is 50 He says he has had an even lower level with an LDL in 20s.  He is doing well     Past Medical History:  Diagnosis Date   Allergy    COVID-19    Dysplastic nevus    left shoulder   Family history of heart disease in male family member before age 68    Hemorrhoid 05/09/2013   Hyperlipidemia    Palpitations    Restless leg syndrome  Vitamin D deficiency     Past Surgical History:  Procedure Laterality Date   HEMORROIDECTOMY  05/09/2013   LEFT HEART CATH AND CORONARY ANGIOGRAPHY N/A 12/29/2021   Procedure: LEFT HEART CATH AND CORONARY ANGIOGRAPHY;  Surgeon: Corky Crafts, MD;  Location: [facility] IN[facility];  Service: Cardiovascular;  Laterality: N/A;   SHOULDER SURGERY Right    spurs   SKIN CANCER EXCISION Left 12/07/2012   SHOULDER   THROAT SURGERY      Current Medications: Current Meds  Medication Sig   cetirizine (ZYRTEC) 10 MG tablet Take 10 mg by mouth daily.   clonazePAM (KLONOPIN) 0.5 MG tablet Take 0.5 mg by mouth.   famotidine (PEPCID) 40 MG tablet Take 40 mg by mouth daily.   rOPINIRole (REQUIP) 2 MG tablet Take 2 mg by mouth daily.   Semaglutide, 2 MG/DOSE, 8 MG/3ML SOPN Inject into the skin.   terbinafine (LAMISIL) 250 MG tablet Take 250 mg by mouth daily.   [DISCONTINUED] Evolocumab (REPATHA SURECLICK) 140 MG/ML SOAJ INJECT 1 PEN INTO THE SKIN EVERY 14 (FOURTEEN) DAYS.   [DISCONTINUED] rosuvastatin (CRESTOR) 20 MG tablet Take 1 tablet (20 mg total) by mouth every  other day.     Allergies:   Patient has no known allergies.   Social History   Socioeconomic History   Marital status: Married    Spouse name: Not on file   Number of children: Not on file   Years of education: Not on file   Highest education level: Not on file  Occupational History   Not on file  Tobacco Use   Smoking status: Never   Smokeless tobacco: Never  Vaping Use   Vaping status: Never Used  Substance and Sexual Activity   Alcohol use: No   Drug use: No   Sexual activity: Not on file  Other Topics Concern   Not on file  Social History Narrative   Not on file   Social Drivers of Health   Financial Resource Strain: Low Risk  (07/12/2022)   Received from [facility], [facility]   Overall Financial Resource Strain (CARDIA)    Difficulty of Paying Living Expenses: Not very hard  Food Insecurity: No Food Insecurity (07/12/2022)   Received from [facility], [facility]   Hunger Vital Sign    Worried About Running Out of Food in the Last Year: Never true    Ran Out of Food in the Last Year: Never true  Transportation Needs: No Transportation Needs (07/12/2022)   Received from [facility], [facility]   PRAPARE - Transportation    Lack of Transportation (Medical): No    Lack of Transportation (Non-Medical): No  Physical Activity: Unknown (07/12/2022)   Received from [facility]   Exercise Vital Sign    Days of Exercise per Week: Patient declined    Minutes of Exercise per Session: Not on file  Stress: No Stress Concern Present (07/12/2022)   Received from [facility], [facility] - Occupational Stress Questionnaire    Feeling of Stress : Only a little  Social Connections: Socially Integrated (07/12/2022)   Received from [facility], [facility]   Social Network    How would you rate your social network (family, work, friends)?: Good participation with social networks     Family History: The patient's  family history includes Cancer in his maternal grandmother; Diabetes in his father; Heart disease in his brother and another family member; Heart disease (age  of onset: 9) in his mother; Heart disease (age of onset: 55) in his father; Hyperlipidemia in his brother, father, mother, and another family member; Hypertension in his brother, father, mother, and another family member. There is no history of Colon cancer, Esophageal cancer, Stomach cancer, or Rectal cancer.  ROS:   Please see the history of present illness.     All other systems reviewed and are negative.  EKGs/Labs/Other Studies Reviewed:    The following studies were reviewed today:   EKG:    EKG Interpretation Date/Time:  Tuesday August 08 2023 09:58:44 EDT Ventricular Rate:  84 PR Interval:  162 QRS Duration:  94 QT Interval:  372 QTC Calculation: 439 R Axis:   64  Text Interpretation: Normal sinus rhythm Normal ECG When compared with ECG of 23-Dec-2021 23:08, the NS interventricular conduction delay  has improved since previous ECG in 2023. Confirmed by Kristeen Miss [phone]) on 08/08/2023 10:21:46 AM     Recent Labs: No results found for requested labs within last 365 days.  Recent Lipid Panel    Component Value Date/Time   CHOL 123 03/30/2022 0825   TRIG 193 (H) 03/30/2022 0825   HDL 41 03/30/2022 0825   CHOLHDL 3.0 03/30/2022 0825   CHOLHDL 3.3 11/27/2014 1018   VLDL 18 11/27/2014 1018   LDLCALC 50 03/30/2022 0825     Risk Assessment/Calculations:            Physical Exam:     Physical Exam: Blood pressure 138/72, pulse 93, height 6\' 1"  (1.854 m), weight 237 lb 12.8 oz (107.9 kg), SpO2 98%.       GEN:  Well nourished, well developed in no acute distress HEENT: Normal NECK: No JVD; No carotid bruits LYMPHATICS: No lymphadenopathy CARDIAC: RRR , no murmurs, rubs, gallops RESPIRATORY:  Clear to auscultation without rales, wheezing or rhonchi  ABDOMEN: Soft, non-tender,  non-distended MUSCULOSKELETAL:  No edema; No deformity  SKIN: Warm and dry NEUROLOGIC:  Alert and oriented x 3     ASSESSMENT:    1. Mixed hyperlipidemia      PLAN:     Chest pain: No further episodes of chest discomfort.  He ran out of his isosorbide and is no longer taking it.  Will discontinue at office list.  2.  Hyperlipidemia: Currently on Repatha.  His last LDL was in the 20s measured by his primary medical doctor.  Will continue rosuvastatin and Repatha.     3.  Elevated Lp(a)   -    Shared Decision Making/Informed Consent{       Medication Adjustments/Labs and Tests Ordered: Current medicines are reviewed at length with the patient today.  Concerns regarding medicines are outlined above.  Orders Placed This Encounter  Procedures   EKG 12-Lead   Meds ordered this encounter  Medications   Evolocumab (REPATHA SURECLICK) 140 MG/ML SOAJ    Sig: Inject 140 mg into the skin every 14 (fourteen) days.    Dispense:  6 mL    Refill:  3   rosuvastatin (CRESTOR) 20 MG tablet    Sig: Take 1 tablet (20 mg total) by mouth every other day.    Dispense:  45 tablet    Refill:  3    Patient Instructions  Medication Instructions:  STOP Imdur/isosorbide mononitrate REFILLED Rosuvastatin/Crestor and Repatha *If you need a refill on your cardiac medications before your next appointment, please call your pharmacy* Follow-Up: At [facility], you and your health needs are our priority.  As  part of our continuing mission to provide you with exceptional heart care, our providers are all part of one team.  This team includes your primary Cardiologist (physician) and Advanced Practice Providers or APPs (Physician Assistants and Nurse Practitioners) who all work together to provide you with the care you need, when you need it.  Your next appointment:   1 year(s)  Provider:   Kristeen Miss, MD      1st Floor: - Lobby - Registration  - Pharmacy  - Lab -  Cafe  2nd Floor: - PV Lab - Diagnostic Testing (echo, CT, nuclear med)  3rd Floor: - Vacant  4th Floor: - TCTS (cardiothoracic surgery) - AFib Clinic - Structural Heart Clinic - Vascular Surgery  - Vascular Ultrasound  5th Floor: - HeartCare Cardiology (general and EP) - Clinical Pharmacy for coumadin, hypertension, lipid, weight-loss medications, and med management appointments    Valet parking services will be available as well.     Signed, Kristeen Miss, MD  08/08/2023 1:37 PM    [facility]

## 2023-08-08 ENCOUNTER — Encounter: Payer: Self-pay | Admitting: Cardiovascular Disease

## 2023-08-08 ENCOUNTER — Ambulatory Visit: Payer: Self-pay | Attending: Cardiovascular Disease | Admitting: Cardiovascular Disease

## 2023-08-08 VITALS — BP 138/72 | HR 93 | Ht 73.0 in | Wt 237.8 lb

## 2023-08-08 DIAGNOSIS — E782 Mixed hyperlipidemia: Secondary | ICD-10-CM

## 2023-08-08 MED ORDER — ROSUVASTATIN CALCIUM 20 MG PO TABS: 20.0000 mg | ORAL_TABLET | ORAL | 3 refills | Status: AC

## 2023-08-08 MED ORDER — REPATHA SURECLICK 140 MG/ML ~~LOC~~ SOAJ: 140.0000 mg | SUBCUTANEOUS | 3 refills | Status: AC

## 2023-08-08 NOTE — Patient Instructions (Signed)
 Medication Instructions:  STOP Imdur/isosorbide mononitrate REFILLED Rosuvastatin/Crestor and Repatha *If you need a refill on your cardiac medications before your next appointment, please call your pharmacy* Follow-Up: At [facility], you and your health needs are our priority.  As part of our continuing mission to provide you with exceptional heart care, our providers are all part of one team.  This team includes your primary Cardiologist (physician) and Advanced Practice Providers or APPs (Physician Assistants and Nurse Practitioners) who all work together to provide you with the care you need, when you need it.  Your next appointment:   1 year(s)  Provider:   Kristeen Miss, MD      1st Floor: - Lobby - Registration  - Pharmacy  - Lab - Cafe  2nd Floor: - PV Lab - Diagnostic Testing (echo, CT, nuclear med)  3rd Floor: - Vacant  4th Floor: - TCTS (cardiothoracic surgery) - AFib Clinic - Structural Heart Clinic - Vascular Surgery  - Vascular Ultrasound  5th Floor: - [facility] (general and EP) - Clinical Pharmacy for coumadin, hypertension, lipid, weight-loss medications, and med management appointments    Valet parking services will be available as well.

## 2024-05-06 ENCOUNTER — Telehealth: Payer: Self-pay | Admitting: Pharmacy Technician

## 2024-05-06 NOTE — Telephone Encounter (Signed)
 Pharmacy Patient Advocate Encounter  Received notification from [facility] that Prior Authorization for repatha  has been APPROVED from 05/06/24 to 05/06/25   PA #/Case ID/Reference #: [id number]

## 2024-05-06 NOTE — Telephone Encounter (Signed)
 Received [id number] from [facility] -repatha  again  I called [facility] since this was approved already - they said they will fix

## 2024-05-06 NOTE — Telephone Encounter (Addendum)
" ° ° °  Pharmacy Patient Advocate Encounter   Received notification from Onbase that prior authorization for REPATHA  is required/requested.   Insurance verification completed.   The patient is insured through [facility].   Per test claim: PA required; PA submitted to above mentioned insurance via Latent Key/confirmation #/[id number] Status is pending  "
